# Patient Record
Sex: Male | Born: 1990
Health system: Southern US, Community
[De-identification: ages and names within clinical notes are randomized; demographics above are authoritative.]

## PROBLEM LIST (undated history)

## (undated) DIAGNOSIS — J302 Other seasonal allergic rhinitis: Secondary | ICD-10-CM

## (undated) DIAGNOSIS — E119 Type 2 diabetes mellitus without complications: Secondary | ICD-10-CM

## (undated) HISTORY — PX: WISDOM TOOTH EXTRACTION: SHX21

## (undated) HISTORY — DX: Type 2 diabetes mellitus without complications: E11.9

## (undated) HISTORY — DX: Other seasonal allergic rhinitis: J30.2

## (undated) HISTORY — PX: ROOT CANAL: SHX2363

## (undated) HISTORY — PX: ACHILLES TENDON REPAIR: SUR1153

---

## 2019-08-19 ENCOUNTER — Encounter: Payer: Self-pay | Admitting: Internal Medicine

## 2019-08-19 ENCOUNTER — Other Ambulatory Visit: Payer: Self-pay

## 2019-08-19 ENCOUNTER — Ambulatory Visit: Payer: Self-pay | Admitting: Internal Medicine

## 2019-08-19 VITALS — BP 118/70 | HR 66 | Temp 98.3°F | Resp 12 | Ht 66.0 in | Wt 203.0 lb

## 2019-08-19 DIAGNOSIS — J302 Other seasonal allergic rhinitis: Secondary | ICD-10-CM | POA: Insufficient documentation

## 2019-08-19 DIAGNOSIS — J0140 Acute pansinusitis, unspecified: Secondary | ICD-10-CM

## 2019-08-19 MED ORDER — AMOXICILLIN-POT CLAVULANATE 875-125 MG PO TABS: ORAL_TABLET | ORAL | 0 refills | Status: DC

## 2019-08-19 NOTE — Progress Notes (Signed)
Subjective:    Patient ID: Juan Friedman, male   DOB: [birth year], 28 y.o.   MRN: [id number]   HPI   Here to establish.  Started with symptoms on 07/21/2019.  Nasal congestion leading to loss of ability to smell.  Had COVID testing that was negative.   After a week, his left sided symptoms resolved.  Right side remains congested and painful.  Right ear hurts as well. Last few days, has had sneezing with bleeding from left nostril.  No posterior pharyngeal drainage. When blows nose, unable to get any mucous out.  Has spontaneous yellow drainage from right nostril. No fevers.  No cough. No dyspnea.  Has history of allergies:  Different pollen and grasses.  Has tried Copywriter, advertising, but no help. Steam helps for a short period of time.  No outpatient medications have been marked as taking for the 08/19/19 encounter (Office Visit) with Mack Hook, MD.   No Known Allergies   No past medical history on file.   No family history on file. Family Status  Relation Name Status  . Mother  Alive, age 65y  . Father  Alive, age 59y  . Son Wisdom Alive, age 71m   Social History   Socioeconomic History  . Marital status: Married    Spouse name: Mikayla  . Number of children: 1  . Years of education: Not on file  . Highest education level: Not on file  Occupational History  . Not on file  Tobacco Use  . Smoking status: Never Smoker  . Smokeless tobacco: Never Used  Substance and Sexual Activity  . Alcohol use: Never  . Drug use: Never  . Sexual activity: Not on file  Other Topics Concern  . Not on file  Social History Narrative   Lives with his wife, young son, and his grand-mother in [place]   Social Determinants of Health   Financial Resource Strain:   . Difficulty of Paying Living Expenses: Not on file  Food Insecurity:   . Worried About Charity fundraiser in the Last Year: Not on file  . Ran Out of Food in the Last Year: Not on file  Transportation Needs:   .  Lack of Transportation (Medical): Not on file  . Lack of Transportation (Non-Medical): Not on file  Physical Activity:   . Days of Exercise per Week: Not on file  . Minutes of Exercise per Session: Not on file  Stress:   . Feeling of Stress : Not on file  Social Connections:   . Frequency of Communication with Friends and Family: Not on file  . Frequency of Social Gatherings with Friends and Family: Not on file  . Attends Religious Services: Not on file  . Active Member of Clubs or Organizations: Not on file  . Attends Archivist Meetings: Not on file  . Marital Status: Not on file  Intimate Partner Violence:   . Fear of Current or Ex-Partner: Not on file  . Emotionally Abused: Not on file  . Physically Abused: Not on file  . Sexually Abused: Not on file              Review of Systems    Objective:   BP 118/70 (BP Location: Left Arm, Patient Position: Sitting, Cuff Size: Normal)   Pulse 66   Temp 98.3 F (36.8 C) (Oral)   Resp 12   Ht 5\' 6"  (1.676 m)   Wt 203 lb (92.1 kg)   BMI  32.77 kg/m   Physical Exam  NAD HEENT:  PERRL, EOMI, TMs pearly gray, right nostril completely obstructed with swelling and discharge.  Tender over right maxillary area. Neck:  Supple, No adenopathy, no thyromegaly Chest:  CTA CV:  RRR with normal HS and no murmur or rub.  Radial and DP pulses normal and equal Abd:  S, NT, + BS LE:  No edema. Assessment & Plan  Sinusitis:  Augmentin 875/125 mg twice daily for 10 days. Consider OTC oral antihistamine for any continued symptoms.

## 2019-08-19 NOTE — Patient Instructions (Signed)
Decongestant with Guaifenesin and phenylephrine. Nasal saline

## 2019-08-29 ENCOUNTER — Ambulatory Visit: Payer: Self-pay | Admitting: Internal Medicine

## 2020-03-12 ENCOUNTER — Emergency Department (HOSPITAL_COMMUNITY)
Admission: EM | Admit: 2020-03-12 | Discharge: 2020-03-12 | Disposition: A | Discharge status: Home / Self Care | Payer: Self-pay | Attending: Emergency Medicine | Admitting: Emergency Medicine

## 2020-03-12 ENCOUNTER — Emergency Department (HOSPITAL_COMMUNITY): Payer: Self-pay

## 2020-03-12 ENCOUNTER — Encounter (HOSPITAL_COMMUNITY): Payer: Self-pay | Admitting: *Deleted

## 2020-03-12 DIAGNOSIS — J029 Acute pharyngitis, unspecified: Secondary | ICD-10-CM | POA: Insufficient documentation

## 2020-03-12 DIAGNOSIS — R0981 Nasal congestion: Secondary | ICD-10-CM | POA: Insufficient documentation

## 2020-03-12 LAB — CBC
HCT: 46.4 % (ref 39.0–52.0)
Hemoglobin: 15.7 g/dL (ref 13.0–17.0)
MCH: 30.3 pg (ref 26.0–34.0)
MCHC: 33.8 g/dL (ref 30.0–36.0)
MCV: 89.6 fL (ref 80.0–100.0)
Platelets: 277 10*3/uL (ref 150–400)
RBC: 5.18 MIL/uL (ref 4.22–5.81)
RDW: 12 % (ref 11.5–15.5)
WBC: 10.5 10*3/uL (ref 4.0–10.5)
nRBC: 0 % (ref 0.0–0.2)

## 2020-03-12 LAB — COMPREHENSIVE METABOLIC PANEL
ALT: 45 U/L — ABNORMAL HIGH (ref 0–44)
AST: 28 U/L (ref 15–41)
Albumin: 4 g/dL (ref 3.5–5.0)
Alkaline Phosphatase: 37 U/L — ABNORMAL LOW (ref 38–126)
Anion gap: 12 (ref 5–15)
BUN: 14 mg/dL (ref 6–20)
CO2: 23 mmol/L (ref 22–32)
Calcium: 8.9 mg/dL (ref 8.9–10.3)
Chloride: 103 mmol/L (ref 98–111)
Creatinine, Ser: 1.41 mg/dL — ABNORMAL HIGH (ref 0.61–1.24)
GFR calc Af Amer: >60 mL/min (ref >60)
GFR calc non Af Amer: >60 mL/min (ref >60)
Glucose, Bld: 180 mg/dL — ABNORMAL HIGH (ref 70–99)
Potassium: 4.2 mmol/L (ref 3.5–5.1)
Sodium: 138 mmol/L (ref 135–145)
Total Bilirubin: 1.5 mg/dL — ABNORMAL HIGH (ref 0.3–1.2)
Total Protein: 7.2 g/dL (ref 6.5–8.1)

## 2020-03-12 LAB — GROUP A STREP BY PCR: Group A Strep by PCR: NOT DETECTED

## 2020-03-12 MED ORDER — PREDNISONE 20 MG PO TABS: 40.0000 mg | ORAL_TABLET | Freq: Every day | ORAL | 0 refills | Status: AC

## 2020-03-12 MED ORDER — IOHEXOL 300 MG/ML  SOLN
75.0000 mL | Freq: Once | INTRAMUSCULAR | Status: AC | PRN
  Administered 2020-03-12: 75 mL via INTRAVENOUS

## 2020-03-12 MED ORDER — SODIUM CHLORIDE 0.9 % IV BOLUS
1000.0000 mL | Freq: Once | INTRAVENOUS | Status: AC
  Administered 2020-03-12: 1000 mL via INTRAVENOUS

## 2020-03-12 MED ORDER — SODIUM CHLORIDE 0.9 % IV SOLN
3.0000 g | Freq: Once | INTRAVENOUS | Status: AC
  Administered 2020-03-12: 3 g via INTRAVENOUS
  Filled 2020-03-12: qty 8

## 2020-03-12 MED ORDER — METHYLPREDNISOLONE SODIUM SUCC 125 MG IJ SOLR
125.0000 mg | Freq: Once | INTRAMUSCULAR | Status: AC
  Administered 2020-03-12: 125 mg via INTRAVENOUS
  Filled 2020-03-12: qty 2

## 2020-03-12 MED ORDER — AMOXICILLIN-POT CLAVULANATE 875-125 MG PO TABS: 1.0000 | ORAL_TABLET | Freq: Two times a day (BID) | ORAL | 0 refills | Status: AC

## 2020-03-12 NOTE — Discharge Instructions (Addendum)
You were evaluated in the Emergency Department and after careful evaluation, we did not find any emergent condition requiring admission or further testing in the hospital.  Your exam/testing today was overall reassuring.  Your symptoms seem to be due to an upper respiratory infection and/or sinus infection.  Your swelling improved while here in the emergency department.  Please take the steroids and antibiotics as prescribed, start these medicines tomorrow morning.  Please return to the Emergency Department if you experience any worsening of your condition.  We encourage you to follow up with a primary care provider.  Thank you for allowing [place] to be a part of your care.

## 2020-03-12 NOTE — ED Notes (Signed)
Voice is clear- able to see uvula, still c/o nasal congestion

## 2020-03-12 NOTE — ED Notes (Signed)
Voice muffled, throat swollen, difficulty swallowing saliva, difficulty breathing.

## 2020-03-12 NOTE — ED Provider Notes (Signed)
[facility] Emergency Department Provider Note MRN:  [id number]  Arrival date & time: 03/12/20     Chief Complaint   Sore Throat and Nasal Congestion   History of Present Illness   Juan Friedman is a 29 y.o. year-old male with a history of seasonal allergies presenting to the ED with chief complaint of sore throat and nasal congestion.  2 or 3 days of sore throat and nasal congestion, progressively worsening, during his few hours in the waiting room his symptoms have worsened and he has noticed that his voice is changing, he is feeling swelling in his throat.  Denies recent fever, no cough, no chest pain or shortness of breath, no abdominal pain, no vomiting or diarrhea, no new medications, no daily medications, no history of throat swelling, no food allergies, no new exposures, no family history of facial or throat swelling.  Review of Systems  A complete 10 system review of systems was obtained and all systems are negative except as noted in the HPI and PMH.   Patient's Health History    Past Medical History:  Diagnosis Date  . Seasonal allergies     Past Surgical History:  Procedure Laterality Date  . ROOT CANAL    . WISDOM TOOTH EXTRACTION      No family history on file.  Social History   Socioeconomic History  . Marital status: Married    Spouse name: Mikayla  . Number of children: 1  . Years of education: Not on file  . Highest education level: Not on file  Occupational History  . Not on file  Tobacco Use  . Smoking status: Never Smoker  . Smokeless tobacco: Never Used  Substance and Sexual Activity  . Alcohol use: Never  . Drug use: Never  . Sexual activity: Not on file  Other Topics Concern  . Not on file  Social History Narrative   Lives with his wife, young son, and his grand-mother in [place]   Social Determinants of Health   Financial Resource Strain:   . Difficulty of Paying Living Expenses:   Food Insecurity:   . Worried  About Programme researcher, broadcasting/film/video in the Last Year:   . Barista in the Last Year:   Transportation Needs:   . Freight forwarder (Medical):   Marland Kitchen Lack of Transportation (Non-Medical):   Physical Activity:   . Days of Exercise per Week:   . Minutes of Exercise per Session:   Stress:   . Feeling of Stress :   Social Connections:   . Frequency of Communication with Friends and Family:   . Frequency of Social Gatherings with Friends and Family:   . Attends Religious Services:   . Active Member of Clubs or Organizations:   . Attends Banker Meetings:   Marland Kitchen Marital Status:   Intimate Partner Violence:   . Fear of Current or Ex-Partner:   . Emotionally Abused:   Marland Kitchen Physically Abused:   . Sexually Abused:      Physical Exam   Vitals:   03/12/20 0740 03/12/20 0800  BP: 136/76 122/79  Pulse: 64 63  Resp: 20 16  Temp:    SpO2: 99% 100%    CONSTITUTIONAL: Well-appearing, NAD NEURO:  Alert and oriented x 3, no focal deficits EYES:  eyes equal and reactive ENT/NECK:  no LAD, no JVD; diffuse swelling to the posterior oropharynx and tonsils with uvula protruding forward on top of the tongue, abnormal phonation CARDIO:  Regular rate, well-perfused, normal S1 and S2 PULM:  CTAB no wheezing or rhonchi GI/GU:  normal bowel sounds, non-distended, non-tender MSK/SPINE:  No gross deformities, no edema SKIN:  no rash, atraumatic PSYCH:  Appropriate speech and behavior  *Additional and/or pertinent findings included in MDM below  Diagnostic and Interventional Summary    EKG Interpretation  Date/Time:    Ventricular Rate:    PR Interval:    QRS Duration:   QT Interval:    QTC Calculation:   R Axis:     Text Interpretation:        Labs Reviewed  COMPREHENSIVE METABOLIC PANEL - Abnormal; Notable for the following components:      Result Value   Glucose, Bld 180 (*)    Creatinine, Ser 1.41 (*)    ALT 45 (*)    Alkaline Phosphatase 37 (*)    Total Bilirubin 1.5 (*)      All other components within normal limits  GROUP A STREP BY PCR  CBC    CT Soft Tissue Neck W Contrast  Final Result      Medications  methylPREDNISolone sodium succinate (SOLU-MEDROL) 125 mg/2 mL injection 125 mg (125 mg Intravenous Given 03/12/20 0737)  sodium chloride 0.9 % bolus 1,000 mL (0 mLs Intravenous Stopped 03/12/20 0940)  Ampicillin-Sulbactam (UNASYN) 3 g in sodium chloride 0.9 % 100 mL IVPB (0 g Intravenous Stopped 03/12/20 0939)  iohexol (OMNIPAQUE) 300 MG/ML solution 75 mL (75 mLs Intravenous Contrast Given 03/12/20 0813)     Procedures  /  Critical Care Procedures  ED Course and Medical Decision Making  I have reviewed the triage vital signs, the nursing notes, and pertinent available records from the EMR.  Listed above are laboratory and imaging tests that I personally ordered, reviewed, and interpreted and then considered in my medical decision making (see below for details).      Concern for airway swelling in this 29 year old male otherwise healthy, suspect underlying infectious process, no family history or personal history of angioedema, no new exposures or medications that would elicit such a reaction.  Will monitor his airway closely, currently in no acute distress, providing IV Solu-Medrol, IV antibiotics.  Will obtain CT imaging to evaluate for abscess or deep space neck infection.  Significant clinical significant clinical improvement here in the emergency department, CT reassuring, given the swelling will provide steroid burst, suspect sinus infection given the opacity on CT scan and the clinical symptoms, providing course of Augmentin as well.  Barth Kirks. Sedonia Small, [place] [email or web address]  Final Clinical Impressions(s) / ED Diagnoses     ICD-10-CM   1. Pharyngitis, unspecified etiology  J02.9     ED Discharge Orders         Ordered    predniSONE (DELTASONE) 20 MG tablet  Daily     03/12/20 0940     amoxicillin-clavulanate (AUGMENTIN) 875-125 MG tablet  Every 12 hours     03/12/20 0940           Discharge Instructions Discussed with and Provided to Patient:     Discharge Instructions     You were evaluated in the Emergency Department and after careful evaluation, we did not find any emergent condition requiring admission or further testing in the hospital.  Your exam/testing today was overall reassuring.  Your symptoms seem to be due to an upper respiratory infection and/or sinus infection.  Your swelling improved while here in the emergency department.  Please take  the steroids and antibiotics as prescribed, start these medicines tomorrow morning.  Please return to the Emergency Department if you experience any worsening of your condition.  We encourage you to follow up with a primary care provider.  Thank you for allowing [place] to be a part of your care.        Sabas Sous, MD 03/12/20 [phone]

## 2020-03-12 NOTE — ED Triage Notes (Signed)
To ED for eval of nasal congestion and sore throat. Started 2-3 days ago. States he typically will get a sinus infection around this time of year. States both sides of throat are sore and painful when swallowing. Airway open and resp e/u. Throat is red without exudate noted. Denies fevers. Taking sudafed at home.

## 2020-03-23 ENCOUNTER — Encounter (HOSPITAL_COMMUNITY): Payer: Self-pay

## 2020-03-23 ENCOUNTER — Emergency Department (HOSPITAL_COMMUNITY)
Admission: EM | Admit: 2020-03-23 | Discharge: 2020-03-23 | Disposition: A | Discharge status: Home / Self Care | Payer: Self-pay | Attending: Emergency Medicine | Admitting: Emergency Medicine

## 2020-03-23 ENCOUNTER — Other Ambulatory Visit: Payer: Self-pay

## 2020-03-23 DIAGNOSIS — J029 Acute pharyngitis, unspecified: Secondary | ICD-10-CM | POA: Insufficient documentation

## 2020-03-23 DIAGNOSIS — J00 Acute nasopharyngitis [common cold]: Secondary | ICD-10-CM | POA: Insufficient documentation

## 2020-03-23 NOTE — ED Provider Notes (Signed)
[place] EMERGENCY DEPARTMENT Provider Note   CSN: [id number] Arrival date & time: 03/23/20  0940     History Chief Complaint  Patient presents with  . Nasal Congestion    Juan Friedman is a 29 y.o. male who presents with scratchy throat.  He has a history of seasonal allergies.  He presents with scratchy throat and nasal congestion.  He had onset of the symptoms about 5 hours after consuming a Snapple apple.  This occurred previously when he drank the same drink. Works at a middle school.  He is unsure if he has been exposed to any viral illnesses.  He gets checked weekly at school for Covid.  He denies fevers. HPI     Past Medical History:  Diagnosis Date  . Seasonal allergies     Patient Active Problem List   Diagnosis Date Noted  . Seasonal allergies     Past Surgical History:  Procedure Laterality Date  . ROOT CANAL    . WISDOM TOOTH EXTRACTION         No family history on file.  Social History   Tobacco Use  . Smoking status: Never Smoker  . Smokeless tobacco: Never Used  Substance Use Topics  . Alcohol use: Never  . Drug use: Never    Home Medications Prior to Admission medications   Medication Sig Start Date End Date Taking? Authorizing Provider  pseudoephedrine (SUDAFED) 30 MG tablet Take 30 mg by mouth every 4 (four) hours as needed for congestion.    [provider]    Allergies    Patient has no known allergies.  Review of Systems   Review of Systems  HENT: Positive for congestion, rhinorrhea and sore throat.      Physical Exam Updated Vital Signs BP (!) 120/105 (BP Location: Left Arm)   Pulse 90   Temp 97.6 F (36.4 C) (Oral)   Resp 18   Ht 5\' 8"  (1.727 m)   Wt 93 kg   SpO2 96%   BMI 31.17 kg/m   Physical Exam Vitals and nursing note reviewed.  Constitutional:      General: He is not in acute distress.    Appearance: He is well-developed. He is not diaphoretic.  HENT:     Head: Normocephalic  and atraumatic.     Nose: Rhinorrhea present.     Mouth/Throat:     Mouth: Mucous membranes are moist.     Pharynx: Posterior oropharyngeal erythema present. No oropharyngeal exudate.  Eyes:     General: No scleral icterus.    Extraocular Movements: Extraocular movements intact.     Conjunctiva/sclera: Conjunctivae normal.     Pupils: Pupils are equal, round, and reactive to light.  Cardiovascular:     Rate and Rhythm: Normal rate and regular rhythm.     Heart sounds: Normal heart sounds.  Pulmonary:     Effort: Pulmonary effort is normal. No respiratory distress.     Breath sounds: Normal breath sounds.  Abdominal:     Palpations: Abdomen is soft.     Tenderness: There is no abdominal tenderness.  Musculoskeletal:     Cervical back: Normal range of motion and neck supple.  Skin:    General: Skin is warm and dry.  Neurological:     Mental Status: He is alert.  Psychiatric:        Behavior: Behavior normal.     ED Results / Procedures / Treatments   Labs (all labs ordered are listed,  but only abnormal results are displayed) Labs Reviewed - No data to display  EKG None  Radiology No results found.  Procedures Procedures (including critical care time)  Medications Ordered in ED Medications - No data to display  ED Course  I have reviewed the triage vital signs and the nursing notes.  Pertinent labs & imaging results that were available during my care of the patient were reviewed by me and considered in my medical decision making (see chart for details).    MDM Rules/Calculators/A&P                     . Patients symptoms are consistent with URI, likely viral etiology. Discussed that antibiotics are not indicated for viral infections. Pt will be discharged with symptomatic treatment.  Verbalizes understanding and is agreeable with plan. Pt is hemodynamically stable & in NAD prior to dc.  Final Clinical Impression(s) / ED Diagnoses Final diagnoses:  Acute  rhinitis  Sore throat    Rx / DC Orders ED Discharge Orders    None       Arthor Captain, PA-C 03/23/20 1044    Margarita Grizzle, MD 03/24/20 1152

## 2020-03-23 NOTE — Discharge Instructions (Addendum)
1) take Xyzal 1 tablet daily before bed 2) take Afrin nasal spray, 1 spray in each nostril 2 times a day for the next 3 days. 3) use over-the-counter cold medication for treatment of your symptoms.  Contact a health care provider if: You have a fever. Your symptoms are getting worse at home. Your symptoms are not responding to medicine. You develop new symptoms, especially a headache or nosebleed.

## 2020-03-23 NOTE — ED Triage Notes (Signed)
Patient complains of throat burning and congestion x 2 days, states that he thinks allergic reaction. Speaking complete sentences

## 2020-04-30 ENCOUNTER — Emergency Department (HOSPITAL_COMMUNITY)
Admission: EM | Admit: 2020-04-30 | Discharge: 2020-05-01 | Disposition: A | Discharge status: Home / Self Care | Payer: Self-pay | Attending: Emergency Medicine | Admitting: Emergency Medicine

## 2020-04-30 DIAGNOSIS — R739 Hyperglycemia, unspecified: Secondary | ICD-10-CM

## 2020-04-30 DIAGNOSIS — R7309 Other abnormal glucose: Secondary | ICD-10-CM

## 2020-04-30 DIAGNOSIS — E1165 Type 2 diabetes mellitus with hyperglycemia: Secondary | ICD-10-CM | POA: Insufficient documentation

## 2020-04-30 LAB — COMPREHENSIVE METABOLIC PANEL
ALT: 16 U/L (ref 0–44)
AST: 14 U/L — ABNORMAL LOW (ref 15–41)
Albumin: 4.4 g/dL (ref 3.5–5.0)
Alkaline Phosphatase: 46 U/L (ref 38–126)
Anion gap: 15 (ref 5–15)
BUN: 11 mg/dL (ref 6–20)
CO2: 18 mmol/L — ABNORMAL LOW (ref 22–32)
Calcium: 9.5 mg/dL (ref 8.9–10.3)
Chloride: 101 mmol/L (ref 98–111)
Creatinine, Ser: 1.37 mg/dL — ABNORMAL HIGH (ref 0.61–1.24)
GFR calc Af Amer: >60 mL/min (ref >60)
GFR calc non Af Amer: >60 mL/min (ref >60)
Glucose, Bld: 300 mg/dL — ABNORMAL HIGH (ref 70–99)
Potassium: 4.3 mmol/L (ref 3.5–5.1)
Sodium: 134 mmol/L — ABNORMAL LOW (ref 135–145)
Total Bilirubin: 2.3 mg/dL — ABNORMAL HIGH (ref 0.3–1.2)
Total Protein: 7.8 g/dL (ref 6.5–8.1)

## 2020-04-30 LAB — CBG MONITORING, ED: Glucose-Capillary: 302 mg/dL — ABNORMAL HIGH (ref 70–99)

## 2020-04-30 LAB — URINALYSIS, ROUTINE W REFLEX MICROSCOPIC
Bilirubin Urine: NEGATIVE
Glucose, UA: >=500 mg/dL — AB
Hgb urine dipstick: NEGATIVE
Ketones, ur: 80 mg/dL — AB
Leukocytes,Ua: NEGATIVE
Nitrite: NEGATIVE
Protein, ur: 30 mg/dL — AB
Specific Gravity, Urine: 1.039 — ABNORMAL HIGH (ref 1.005–1.030)
pH: 5 (ref 5.0–8.0)

## 2020-04-30 LAB — CBC
HCT: 46.3 % (ref 39.0–52.0)
Hemoglobin: 16.1 g/dL (ref 13.0–17.0)
MCH: 30.4 pg (ref 26.0–34.0)
MCHC: 34.8 g/dL (ref 30.0–36.0)
MCV: 87.5 fL (ref 80.0–100.0)
Platelets: 296 10*3/uL (ref 150–400)
RBC: 5.29 MIL/uL (ref 4.22–5.81)
RDW: 13.1 % (ref 11.5–15.5)
WBC: 7.7 10*3/uL (ref 4.0–10.5)
nRBC: 0 % (ref 0.0–0.2)

## 2020-04-30 MED ORDER — SODIUM CHLORIDE 0.9 % IV BOLUS
1000.0000 mL | Freq: Once | INTRAVENOUS | Status: AC
  Administered 2020-05-01: 1000 mL via INTRAVENOUS

## 2020-04-30 NOTE — ED Triage Notes (Signed)
Pt here from home with c/o frequent urination and thirst and some wt loss , pt wants to see if he is diabetic , pt has grandparents who are diabetic

## 2020-05-01 ENCOUNTER — Telehealth: Payer: Self-pay

## 2020-05-01 LAB — HEMOGLOBIN A1C
Hgb A1c MFr Bld: 12.7 % — ABNORMAL HIGH (ref 4.8–5.6)
Mean Plasma Glucose: 317.79 mg/dL

## 2020-05-01 LAB — CBG MONITORING, ED
Glucose-Capillary: 405 mg/dL — ABNORMAL HIGH (ref 70–99)
Glucose-Capillary: 343 mg/dL — ABNORMAL HIGH (ref 70–99)

## 2020-05-01 LAB — MAGNESIUM: Magnesium: 1.8 mg/dL (ref 1.7–2.4)

## 2020-05-01 LAB — CK: Total CK: 103 U/L (ref 49–397)

## 2020-05-01 MED ORDER — LIVING WELL WITH DIABETES BOOK
Freq: Once | Status: AC
  Filled 2020-05-01: qty 1

## 2020-05-01 MED ORDER — INSULIN GLARGINE 100 UNIT/ML ~~LOC~~ SOLN: 10.0000 [IU] | Freq: Every day | SUBCUTANEOUS | 1 refills | Status: DC

## 2020-05-01 MED ORDER — NEEDLES & SYRINGES MISC: 0 refills | Status: DC

## 2020-05-01 MED ORDER — INSULIN GLARGINE 100 UNIT/ML ~~LOC~~ SOLN
10.0000 [IU] | Freq: Once | SUBCUTANEOUS | Status: AC
  Administered 2020-05-01: 10 [IU] via SUBCUTANEOUS
  Filled 2020-05-01: qty 0.1

## 2020-05-01 MED ORDER — INSULIN ASPART 100 UNIT/ML ~~LOC~~ SOLN
5.0000 [IU] | Freq: Once | SUBCUTANEOUS | Status: AC
  Administered 2020-05-01: 5 [IU] via INTRAVENOUS

## 2020-05-01 MED ORDER — INSULIN STARTER KIT- SYRINGES (ENGLISH)
1.0000 | Freq: Once | Status: AC
  Administered 2020-05-01: 1
  Filled 2020-05-01: qty 1

## 2020-05-01 MED ORDER — BLOOD GLUCOSE MONITOR KIT: PACK | 0 refills | Status: AC

## 2020-05-01 NOTE — ED Notes (Signed)
pharmacist at bedside teaching pt about insulin.

## 2020-05-01 NOTE — Telephone Encounter (Signed)
Called patient to see how he was doing, and tell him about MATCH program. Match paperwork sent to his pharmacy [facility] on [address]. They I spoke with them, faxed over [facility] paperwork. they Are aware of patient and will help him navigate with glucose meters. Answered questions, patient expressed that he felt he received good and plenty of information that was understandable in the ED and feels able to perform injections and glucose monitoring. Will be following up with community health on Monday to get a appointment

## 2020-05-01 NOTE — Discharge Instructions (Addendum)
Thank you for allowing me to care for you today in the Emergency Department.    Your blood sugar was elevated today.  We have started you on a type of insulin called Lantus.  Inject 10 units of Lantus into the skin every night.  You can take your prescriptions to the pharmacy at [facility] to get them filled.   You should receive a call from the social worker at the hospital tomorrow to help you get set up with an appointment with your primary care clinician.  They can also give you additional instructions on getting your medications filled at the pharmacy.  You were given a dose of Lantus tonight in the ER.  You should return to the emergency department if you develop shortness of breath, confusion, severe, uncontrollable abdominal pain, vomiting, or other new, concerning symptoms.

## 2020-05-01 NOTE — ED Provider Notes (Signed)
[facility] EMERGENCY DEPARTMENT Provider Note   CSN: [id number] Arrival date & time: 04/30/20  1738     History No chief complaint on file.   Juan Friedman is a 29 y.o. male with a history of seasonal allergies who presents to the emergency department with a chief complaint of polyuria and polydipsia.  The patient reports that he was seen in the ER for a sore throat on 5/22 and was discharged with Augmentin and a 5-day course of steroids.  He reports that since finishing the course of steroids that he has had gradually worsening polyuria, polydipsia, dry mouth, paresthesias, blurry vision, fatigue, and weight loss.  He reports that over the last 3 days that he began having cramping in his bilateral calves at night.  Despite drinking plenty of fluids, his urine has been very clear.  He was concerned that due to the frequency that he might be developing a UTI so he has been drinking cranberry juice to try and help with his symptoms.  He reports that his grandparents have diabetes and he spoke with his mom about his symptoms and she advised him to come and get tested.  He denies fever, chills, vomiting, diarrhea, diplopia, chest pain, shortness of breath, confusion.  No treatment prior to arrival.  The history is provided by the patient. No language interpreter was used.       Past Medical History:  Diagnosis Date  . Seasonal allergies     Patient Active Problem List   Diagnosis Date Noted  . Seasonal allergies     Past Surgical History:  Procedure Laterality Date  . ROOT CANAL    . WISDOM TOOTH EXTRACTION         No family history on file.  Social History   Tobacco Use  . Smoking status: Never Smoker  . Smokeless tobacco: Never Used  Vaping Use  . Vaping Use: Never used  Substance Use Topics  . Alcohol use: Never  . Drug use: Never    Home Medications Prior to Admission medications   Medication Sig Start Date End Date Taking? Authorizing  Provider  blood glucose meter kit and supplies KIT Dispense based on patient and insurance preference. Use up to four times daily as directed. (FOR ICD-9 250.00, 250.01). 05/01/20   Trayvon Trumbull A, PA-C  insulin glargine (LANTUS) 100 UNIT/ML injection Inject 0.1 mLs (10 Units total) into the skin daily. 05/01/20 05/31/20  Deegan Valentino A, PA-C  Needles & Syringes MISC Supply as needed to go with lancets. 05/01/20   Lanayah Gartley A, PA-C    Allergies    Patient has no known allergies.  Review of Systems   Review of Systems  Constitutional: Positive for fatigue and unexpected weight change. Negative for appetite change, chills and fever.  HENT: Negative for congestion, sinus pressure and sore throat.        Xerostomia  Respiratory: Negative for shortness of breath.   Cardiovascular: Negative for chest pain.  Gastrointestinal: Negative for abdominal pain, diarrhea, nausea and vomiting.  Endocrine: Positive for polydipsia and polyuria.  Genitourinary: Positive for frequency. Negative for dysuria.  Musculoskeletal: Positive for myalgias. Negative for back pain.  Skin: Negative for rash.  Allergic/Immunologic: Negative for immunocompromised state.  Neurological: Negative for seizures, syncope, weakness, numbness and headaches.  Psychiatric/Behavioral: Negative for confusion.    Physical Exam Updated Vital Signs BP 111/77   Pulse (!) 58   Temp 98.6 F (37 C) (Oral)   Resp 18  SpO2 99%   Physical Exam Vitals and nursing note reviewed.  Constitutional:      General: He is not in acute distress.    Appearance: He is well-developed. He is not ill-appearing, toxic-appearing or diaphoretic.  HENT:     Head: Normocephalic.     Mouth/Throat:     Mouth: Mucous membranes are moist.  Eyes:     Conjunctiva/sclera: Conjunctivae normal.     Pupils: Pupils are equal, round, and reactive to light.  Cardiovascular:     Rate and Rhythm: Normal rate and regular rhythm.     Heart sounds: No murmur  heard.   Pulmonary:     Effort: Pulmonary effort is normal. No respiratory distress.     Breath sounds: Normal breath sounds. No stridor. No wheezing, rhonchi or rales.  Chest:     Chest wall: No tenderness.  Abdominal:     General: There is no distension.     Palpations: Abdomen is soft. There is no mass.     Tenderness: There is no abdominal tenderness. There is no right CVA tenderness, left CVA tenderness, guarding or rebound.     Hernia: No hernia is present.     Comments: Abdomen soft, nontender, nondistended.  Musculoskeletal:     Cervical back: Neck supple.  Skin:    General: Skin is warm and dry.     Capillary Refill: Capillary refill takes less than 2 seconds.  Neurological:     Mental Status: He is alert.  Psychiatric:        Behavior: Behavior normal.     ED Results / Procedures / Treatments   Labs (all labs ordered are listed, but only abnormal results are displayed) Labs Reviewed  COMPREHENSIVE METABOLIC PANEL - Abnormal; Notable for the following components:      Result Value   Sodium 134 (*)    CO2 18 (*)    Glucose, Bld 300 (*)    Creatinine, Ser 1.37 (*)    AST 14 (*)    Total Bilirubin 2.3 (*)    All other components within normal limits  URINALYSIS, ROUTINE W REFLEX MICROSCOPIC - Abnormal; Notable for the following components:   Specific Gravity, Urine 1.039 (*)    Glucose, UA >=500 (*)    Ketones, ur 80 (*)    Protein, ur 30 (*)    Bacteria, UA RARE (*)    All other components within normal limits  HEMOGLOBIN A1C - Abnormal; Notable for the following components:   Hgb A1c MFr Bld 12.7 (*)    All other components within normal limits  CBG MONITORING, ED - Abnormal; Notable for the following components:   Glucose-Capillary 302 (*)    All other components within normal limits  CBG MONITORING, ED - Abnormal; Notable for the following components:   Glucose-Capillary 405 (*)    All other components within normal limits  CBG MONITORING, ED -  Abnormal; Notable for the following components:   Glucose-Capillary 343 (*)    All other components within normal limits  CBC  MAGNESIUM  CK    EKG None  Radiology No results found.  Procedures Procedures (including critical care time)  Medications Ordered in ED Medications  sodium chloride 0.9 % bolus 1,000 mL (0 mLs Intravenous Stopped 05/01/20 0152)  insulin glargine (LANTUS) injection 10 Units (10 Units Subcutaneous Given 05/01/20 0330)  insulin aspart (novoLOG) injection 5 Units (5 Units Intravenous Given 05/01/20 0328)  living well with diabetes book MISC ( Does not apply Given  05/01/20 0526)  insulin starter kit- syringes (English) 1 kit (1 kit Other Given 05/01/20 0526)    ED Course  I have reviewed the triage vital signs and the nursing notes.  Pertinent labs & imaging results that were available during my care of the patient were reviewed by me and considered in my medical decision making (see chart for details).    MDM Rules/Calculators/A&P                          29 year old male presenting with a month of polyuria, polydipsia, fatigue, unexplained weight loss, blurred vision who has developed muscle cramps over the last 3 days.  He has hyperglycemia of 300 with bicarb of 18, but anion gap is normal.  UA with greater than 500 glucosuria as well as ketones.  UA is noninfectious.  Regarding his muscle cramps, he has no electrolyte derangements and CK is normal.  While awaiting electrolytes, obtained A1c, which was noted to be elevated at 12.7.  Patient was initially treated with IV fluids for hyperglycemia as he does not meet criteria for DKA or HHS.  Discussed via phone with Dr. Hal Hope, hospitalist, since patient does not currently have medical insurance and is just getting diagnosed with diabetes mellitus.  He recommends starting the patient on 10 of Lantus daily and providing diabetes education in the ER.  10 of Lantus given in the ER, but when CBG was rechecked  following fluids, it had increased to 400.  NovoLog was given and glucose is now downtrending.  Diabetes education provided by pharmacy.  Transition of care consult has been placed as patient will need to be established with primary care and may also require medication assistance since he does not currently have insurance.  Will give the paper prescription so he can take this to the [facility] pharmacy once he speaks with case management.  The patient was discussed and independently evaluated with Dr. Christy Gentles, attending physician.  He is hemodynamically stable and in no acute distress.  He was observed for many hours in the ER without decompensation.  Safe for discharge home with outpatient follow-up as indicated.  Final Clinical Impression(s) / ED Diagnoses Final diagnoses:  Hyperglycemia  Elevated hemoglobin A1c    Rx / DC Orders ED Discharge Orders         Ordered    insulin glargine (LANTUS) 100 UNIT/ML injection  Daily     Discontinue  Reprint     05/01/20 0518    blood glucose meter kit and supplies KIT     Discontinue  Reprint     05/01/20 0518    Needles & Syringes MISC     Discontinue  Reprint     05/01/20 0518           Melaya Hoselton A, PA-C 05/01/20 0102    Ripley Fraise, MD 05/01/20 2356

## 2020-05-01 NOTE — ED Notes (Signed)
Pt verbalized understanding of d/c instructions, follow up carea nd s/s requiring return to ed. Pt had no further questions and ambulated to exit.

## 2020-05-06 ENCOUNTER — Telehealth: Payer: Self-pay

## 2020-05-06 NOTE — Telephone Encounter (Signed)
Called into my phone regarding diabetes specialist returned call, and instructed him to [facility] and make appointment to get medications, diabetes education and specialist  Consulted, they only take insurance, refer self- pay to [facility].  He will call [facility]- discussed long wait on phone, and to hang in there.

## 2020-07-20 ENCOUNTER — Encounter: Payer: Self-pay | Admitting: Family Medicine

## 2020-07-20 ENCOUNTER — Other Ambulatory Visit: Payer: Self-pay

## 2020-07-20 ENCOUNTER — Ambulatory Visit: Payer: Self-pay | Attending: Family Medicine | Admitting: Family Medicine

## 2020-07-20 VITALS — BP 122/76 | HR 74 | Temp 98.1°F | Ht 68.0 in | Wt 197.2 lb

## 2020-07-20 DIAGNOSIS — Z1159 Encounter for screening for other viral diseases: Secondary | ICD-10-CM

## 2020-07-20 DIAGNOSIS — Z794 Long term (current) use of insulin: Secondary | ICD-10-CM

## 2020-07-20 DIAGNOSIS — E119 Type 2 diabetes mellitus without complications: Secondary | ICD-10-CM

## 2020-07-20 LAB — GLUCOSE, POCT (MANUAL RESULT ENTRY): POC Glucose: 108 mg/dl — AB (ref 70–99)

## 2020-07-20 MED ORDER — INSULIN GLARGINE 100 UNIT/ML ~~LOC~~ SOLN: 10.0000 [IU] | Freq: Every day | SUBCUTANEOUS | 6 refills | Status: DC

## 2020-07-20 MED ORDER — NEEDLES & SYRINGES MISC: 6 refills | Status: AC

## 2020-07-20 NOTE — Progress Notes (Signed)
Subjective:  Patient ID: Juan Friedman, male    DOB: Jun 17, 1991  Age: 29 y.o. MRN: [id number]  CC: New Patient (Initial Visit)   HPI Juan Friedman is a 29 year old male who presents today to establish care. He relocated to [place] from [place] 3 years ago and since being in [place] he has [facility] Amil Amen. Diagnosed with Type 2 DM in 04/2020 with an A1c of 12.7.  He does have a family history of diabetes mellitus. Blood sugars have been in the low 100s with the highest around145 He would like to see "a diabetes specialist". Has no additional concerns today.  Past Medical History:  Diagnosis Date  . Diabetes mellitus without complication ([facility])   . Seasonal allergies     Past Surgical History:  Procedure Laterality Date  . ROOT CANAL    . WISDOM TOOTH EXTRACTION      Family History  Problem Relation Age of Onset  . Diabetes Maternal Grandmother   . Diabetes Paternal Grandmother     No Known Allergies  Outpatient Medications Prior to Visit  Medication Sig Dispense Refill  . blood glucose meter kit and supplies KIT Dispense based on patient and insurance preference. Use up to four times daily as directed. (FOR ICD-9 250.00, 250.01). 1 each 0  . Needles & Syringes MISC Supply as needed to go with lancets. 1 each 0  . insulin glargine (LANTUS) 100 UNIT/ML injection Inject 0.1 mLs (10 Units total) into the skin daily. 10 mL 1   No facility-administered medications prior to visit.     ROS Review of Systems  Constitutional: Negative for activity change and appetite change.  HENT: Negative for sinus pressure and sore throat.   Eyes: Negative for visual disturbance.  Respiratory: Negative for cough, chest tightness and shortness of breath.   Cardiovascular: Negative for chest pain and leg swelling.  Gastrointestinal: Negative for abdominal distention, abdominal pain, constipation and diarrhea.  Endocrine: Negative.     Genitourinary: Negative for dysuria.  Musculoskeletal: Negative for joint swelling and myalgias.  Skin: Negative for rash.  Allergic/Immunologic: Negative.   Neurological: Negative for weakness, light-headedness and numbness.  Psychiatric/Behavioral: Negative for dysphoric mood and suicidal ideas.    Objective:  BP 122/76   Pulse 74   Temp 98.1 F (36.7 C) (Oral)   Ht '5\' 8"'  (1.727 m)   Wt 197 lb 3.2 oz (89.4 kg)   SpO2 98%   BMI 29.98 kg/m   BP/Weight 07/20/2020 3/50/0938 10/31/2991  Systolic BP 716 967 893  Diastolic BP 76 77 810  Wt. (Lbs) 197.2 - 205  BMI 29.98 - 31.17      Physical Exam Constitutional:      Appearance: He is well-developed.  Neck:     Vascular: No JVD.  Cardiovascular:     Rate and Rhythm: Normal rate.     Heart sounds: Normal heart sounds. No murmur heard.   Pulmonary:     Effort: Pulmonary effort is normal.     Breath sounds: Normal breath sounds. No wheezing or rales.  Chest:     Chest wall: No tenderness.  Abdominal:     General: Bowel sounds are normal. There is no distension.     Palpations: Abdomen is soft. There is no mass.     Tenderness: There is no abdominal tenderness.  Musculoskeletal:        General: Normal range of motion.     Right lower leg: No  edema.     Left lower leg: No edema.  Neurological:     Mental Status: He is alert and oriented to person, place, and time.  Psychiatric:        Mood and Affect: Mood normal.      Diabetic Foot Exam - Simple   Simple Foot Form Diabetic Foot exam was performed with the following findings: Yes 07/20/2020  2:33 PM  Visual Inspection No deformities, no ulcerations, no other skin breakdown bilaterally: Yes Sensation Testing Intact to touch and monofilament testing bilaterally: Yes Pulse Check Posterior Tibialis and Dorsalis pulse intact bilaterally: Yes Comments     CMP Latest Ref Rng & Units 04/30/2020 03/12/2020  Glucose 70 - 99 mg/dL 300(H) 180(H)  BUN 6 - 20 mg/dL 11 14   Creatinine 0.61 - 1.24 mg/dL 1.37(H) 1.41(H)  Sodium 135 - 145 mmol/L 134(L) 138  Potassium 3.5 - 5.1 mmol/L 4.3 4.2  Chloride 98 - 111 mmol/L 101 103  CO2 22 - 32 mmol/L 18(L) 23  Calcium 8.9 - 10.3 mg/dL 9.5 8.9  Total Protein 6.5 - 8.1 g/dL 7.8 7.2  Total Bilirubin 0.3 - 1.2 mg/dL 2.3(H) 1.5(H)  Alkaline Phos 38 - 126 U/L 46 37(L)  AST 15 - 41 U/L 14(L) 28  ALT 0 - 44 U/L 16 45(H)    Lipid Panel  No results found for: CHOL, TRIG, HDL, CHOLHDL, VLDL, LDLCALC, LDLDIRECT  CBC    Component Value Date/Time   WBC 7.7 04/30/2020 1806   RBC 5.29 04/30/2020 1806   HGB 16.1 04/30/2020 1806   HCT 46.3 04/30/2020 1806   PLT 296 04/30/2020 1806   MCV 87.5 04/30/2020 1806   MCH 30.4 04/30/2020 1806   MCHC 34.8 04/30/2020 1806   RDW 13.1 04/30/2020 1806    Lab Results  Component Value Date   HGBA1C 12.7 (H) 05/01/2020    Assessment & Plan:  1. Type 2 diabetes mellitus without complication, with long-term current use of insulin (HCC) Uncontrolled with A1c of 12.7; goal is less than 7.0 We will refer to clinical pharmacist in-house for diabetic teaching He is due for A1c in 2 weeks Based on the fact that blood sugar log reveals improvement I will hold off on making any adjustments today until his A1c is obtained Counseled on Diabetic diet, my plate method, 782 minutes of moderate intensity exercise/week Blood sugar logs with fasting goals of 80-120 mg/dl, random of less than 180 and in the event of sugars less than 60 mg/dl or greater than 400 mg/dl encouraged to notify the clinic. Advised on the need for annual eye exams, annual foot exams, Pneumonia vaccine. - POCT glucose (manual entry) - insulin glargine (LANTUS) 100 UNIT/ML injection; Inject 0.1 mLs (10 Units total) into the skin daily.  Dispense: 10 mL; Refill: 6 - Needles & Syringes MISC; For use with Lantus  Dispense: 100 each; Refill: 6 - CMP14+EGFR - Microalbumin / creatinine urine ratio - Hemoglobin A1c; Future  2.  Need for hepatitis C screening test - HCV RNA quant rflx ultra or genotyp([facility]/Sun[facility])    Meds ordered this encounter  Medications  . insulin glargine (LANTUS) 100 UNIT/ML injection    Sig: Inject 0.1 mLs (10 Units total) into the skin daily.    Dispense:  10 mL    Refill:  6  . Needles & Syringes MISC    Sig: For use with Lantus    Dispense:  100 each    Refill:  6    Follow-up: Return  in about 13 days (around 08/02/2020) for [facility] - diabetic teaching; PCP 3 months.       Charlott Rakes, MD, FAAFP. [facility] [place], [place]   07/20/2020, 4:05 PM

## 2020-07-20 NOTE — Progress Notes (Signed)
Has a family history of diabetes.  Needs refill.   [id number]

## 2020-07-20 NOTE — Patient Instructions (Signed)
Diabetes Mellitus and Foot Care Foot care is an important part of your health, especially when you have diabetes. Diabetes may cause you to have problems because of poor blood flow (circulation) to your feet and legs, which can cause your skin to:  Become thinner and drier.  Break more easily.  Heal more slowly.  Peel and crack. You may also have nerve damage (neuropathy) in your legs and feet, causing decreased feeling in them. This means that you may not notice minor injuries to your feet that could lead to more serious problems. Noticing and addressing any potential problems early is the best way to prevent future foot problems. How to care for your feet Foot hygiene  Wash your feet daily with warm water and mild soap. Do not use hot water. Then, pat your feet and the areas between your toes until they are completely dry. Do not soak your feet as this can dry your skin.  Trim your toenails straight across. Do not dig under them or around the cuticle. File the edges of your nails with an emery board or nail file.  Apply a moisturizing lotion or petroleum jelly to the skin on your feet and to dry, brittle toenails. Use lotion that does not contain alcohol and is unscented. Do not apply lotion between your toes. Shoes and socks  Wear clean socks or stockings every day. Make sure they are not too tight. Do not wear knee-high stockings since they may decrease blood flow to your legs.  Wear shoes that fit properly and have enough cushioning. Always look in your shoes before you put them on to be sure there are no objects inside.  To break in new shoes, wear them for just a few hours a day. This prevents injuries on your feet. Wounds, scrapes, corns, and calluses  Check your feet daily for blisters, cuts, bruises, sores, and redness. If you cannot see the bottom of your feet, use a mirror or ask someone for help.  Do not cut corns or calluses or try to remove them with medicine.  If you  find a minor scrape, cut, or break in the skin on your feet, keep it and the skin around it clean and dry. You may clean these areas with mild soap and water. Do not clean the area with peroxide, alcohol, or iodine.  If you have a wound, scrape, corn, or callus on your foot, look at it several times a day to make sure it is healing and not infected. Check for: ? Redness, swelling, or pain. ? Fluid or blood. ? Warmth. ? Pus or a bad smell. General instructions  Do not cross your legs. This may decrease blood flow to your feet.  Do not use heating pads or hot water bottles on your feet. They may burn your skin. If you have lost feeling in your feet or legs, you may not know this is happening until it is too late.  Protect your feet from hot and cold by wearing shoes, such as at the beach or on hot pavement.  Schedule a complete foot exam at least once a year (annually) or more often if you have foot problems. If you have foot problems, report any cuts, sores, or bruises to your health care provider immediately. Contact a health care provider if:  You have a medical condition that increases your risk of infection and you have any cuts, sores, or bruises on your feet.  You have an injury that is not   healing.  You have redness on your legs or feet.  You feel burning or tingling in your legs or feet.  You have pain or cramps in your legs and feet.  Your legs or feet are numb.  Your feet always feel cold.  You have pain around a toenail. Get help right away if:  You have a wound, scrape, corn, or callus on your foot and: ? You have pain, swelling, or redness that gets worse. ? You have fluid or blood coming from the wound, scrape, corn, or callus. ? Your wound, scrape, corn, or callus feels warm to the touch. ? You have pus or a bad smell coming from the wound, scrape, corn, or callus. ? You have a fever. ? You have a red line going up your leg. Summary  Check your feet every day  for cuts, sores, red spots, swelling, and blisters.  Moisturize feet and legs daily.  Wear shoes that fit properly and have enough cushioning.  If you have foot problems, report any cuts, sores, or bruises to your health care provider immediately.  Schedule a complete foot exam at least once a year (annually) or more often if you have foot problems. This information is not intended to replace advice given to you by your health care provider. Make sure you discuss any questions you have with your health care provider. Document Revised: 07/02/2019 Document Reviewed: 11/10/2016 Elsevier Patient Education  2020 Elsevier Inc.  

## 2020-07-22 ENCOUNTER — Telehealth: Payer: Self-pay | Admitting: Family Medicine

## 2020-07-22 NOTE — Telephone Encounter (Signed)
Patient is calling for patient medication assistance for insulin glargine (LANTUS) 100 UNIT/ML injection [[id number]] and syringes please advise CB- [phone]

## 2020-07-23 ENCOUNTER — Other Ambulatory Visit: Payer: Self-pay | Admitting: Pharmacist

## 2020-07-23 DIAGNOSIS — Z794 Long term (current) use of insulin: Secondary | ICD-10-CM

## 2020-07-23 MED ORDER — "INSULIN SYRINGE-NEEDLE U-100 31G X 5/16"" 0.3 ML MISC": 2 refills | Status: DC

## 2020-07-23 MED ORDER — INSULIN GLARGINE 100 UNIT/ML ~~LOC~~ SOLN: 10.0000 [IU] | Freq: Every day | SUBCUTANEOUS | 6 refills | Status: DC

## 2020-07-23 MED FILL — !LANTUS 100 UNITS/ML VIAL: 100 | 28 days supply | Qty: 10 | Fill #0

## 2020-07-23 MED FILL — TRUEPLUS SYR 0.3ML 31GX5/16: 31G X 5/16" | 90 days supply | Qty: 100 | Fill #0

## 2020-07-23 NOTE — Telephone Encounter (Signed)
Patient is calling back to speak to [facility] in regards to medication Patient assistance. Please advise CB- [phone]

## 2020-07-23 NOTE — Progress Notes (Signed)
Call placed to patient. He has [employer] [place] Medicaid and therefore no insurance coverage. I resent his insulin to our pharmacy so he could get started with patient assistance.

## 2020-07-23 NOTE — Telephone Encounter (Signed)
Item already addressed. Pt was called and rx for Lantus was sent to our pharmacy for patient assistance.

## 2020-08-03 ENCOUNTER — Ambulatory Visit: Payer: Medicaid Other | Attending: Family Medicine | Admitting: Pharmacist

## 2020-08-03 ENCOUNTER — Other Ambulatory Visit: Payer: Self-pay

## 2020-08-03 ENCOUNTER — Encounter: Payer: Self-pay | Admitting: Pharmacist

## 2020-08-03 DIAGNOSIS — Z794 Long term (current) use of insulin: Secondary | ICD-10-CM

## 2020-08-03 DIAGNOSIS — E119 Type 2 diabetes mellitus without complications: Secondary | ICD-10-CM

## 2020-08-03 DIAGNOSIS — Z1159 Encounter for screening for other viral diseases: Secondary | ICD-10-CM

## 2020-08-03 LAB — GLUCOSE, POCT (MANUAL RESULT ENTRY): POC Glucose: 97 mg/dl (ref 70–99)

## 2020-08-03 NOTE — Progress Notes (Signed)
    S:    PCP: Dr. Alvis Lemmings   No chief complaint on file.  Patient arrives in good spirits.  Presents for diabetes evaluation, education, and management. Patient was referred and last seen by Primary Care Provider on 07/20/2020.  Pt was dx with Type 2 DM in 04/2020 with an A1c of 12.7.   Family/Social History:  -Fhx: DM -Tobacco: never smoker  -Alcohol: denies use   Insurance coverage/medication affordability: [place] Medicaid (Family Planning); no rx drug coverage. Pt gets his medication from our pharmacy.   Medication adherence reported.   Current diabetes medications include: Latnus 10 units daily   Patient denies hypoglycemic events.  Patient reported dietary habits: Eats 2-3 meals/day Breakfast: fruit smoothies + Powerade; sometimes [place] sausage   Lunch: will sometimes pick this up Dinner: lean poultry + greens + rice   Patient-reported exercise habits:  - Childcare    Patient denies polyuria, polydipsia, or polyphagia.  Patient denies neuropathy (nerve pain). Patient denies visual changes. Patient reports self foot exams.     O:  POCT: 97   Lab Results  Component Value Date   HGBA1C 12.7 (H) 05/01/2020   There were no vitals filed for this visit.  Lipid Panel  No results found for: CHOL, TRIG, HDL, CHOLHDL, VLDL, LDLCALC, LDLDIRECT  Home fasting blood sugars: low 100s per pt   2 hour post-meal/random blood sugars: no values <150.   Clinical Atherosclerotic Cardiovascular Disease (ASCVD): No  The ASCVD Risk score Denman George DC Jr., et al., 2013) failed to calculate for the following reasons:   The 2013 ASCVD risk score is only valid for ages 46 to 13    A/P: Diabetes newly dx currently uncontrolled based on A1c, however, home glycemic control has improved. Anticipate A1c today to be improved. Patient is able to verbalize appropriate hypoglycemia management plan. Medication adherence appears appropriate.  -Continue current regimen. -Extensively discussed  pathophysiology of diabetes, recommended lifestyle interventions, dietary effects on blood sugar control -Counseled on s/sx of and management of hypoglycemia -Labs today per PCP   Written patient instructions provided.  Total time in face to face counseling 30 minutes.   Follow up w/ PCP Clinic Visit 10/20/20.    Butch Penny, PharmD, CPP Clinical Pharmacist [facility] & [facility] [phone]

## 2020-08-05 ENCOUNTER — Other Ambulatory Visit: Payer: Self-pay | Admitting: Family Medicine

## 2020-08-05 LAB — CMP14+EGFR
ALT: 16 IU/L (ref 0–44)
AST: 13 IU/L (ref 0–40)
Albumin/Globulin Ratio: 1.8 (ref 1.2–2.2)
Albumin: 4.6 g/dL (ref 4.1–5.2)
Alkaline Phosphatase: 39 IU/L — ABNORMAL LOW (ref 44–121)
BUN/Creatinine Ratio: 12 (ref 9–20)
BUN: 13 mg/dL (ref 6–20)
Bilirubin Total: 1.1 mg/dL (ref 0.0–1.2)
CO2: 25 mmol/L (ref 20–29)
Calcium: 9.6 mg/dL (ref 8.7–10.2)
Chloride: 100 mmol/L (ref 96–106)
Creatinine, Ser: 1.11 mg/dL (ref 0.76–1.27)
GFR calc Af Amer: 103 mL/min/{1.73_m2} (ref >59)
GFR calc non Af Amer: 89 mL/min/{1.73_m2} (ref >59)
Globulin, Total: 2.6 g/dL (ref 1.5–4.5)
Glucose: 88 mg/dL (ref 65–99)
Potassium: 4.4 mmol/L (ref 3.5–5.2)
Sodium: 137 mmol/L (ref 134–144)
Total Protein: 7.2 g/dL (ref 6.0–8.5)

## 2020-08-05 LAB — LIPID PANEL
Chol/HDL Ratio: 4.7 ratio (ref 0.0–5.0)
Cholesterol, Total: 221 mg/dL — ABNORMAL HIGH (ref 100–199)
HDL: 47 mg/dL (ref >39)
LDL Chol Calc (NIH): 149 mg/dL — ABNORMAL HIGH (ref 0–99)
Triglycerides: 140 mg/dL (ref 0–149)
VLDL Cholesterol Cal: 25 mg/dL (ref 5–40)

## 2020-08-05 LAB — HCV RNA QUANT RFLX ULTRA OR GENOTYP: HCV Quant Baseline: NOT DETECTED IU/mL

## 2020-08-05 LAB — MICROALBUMIN / CREATININE URINE RATIO
Creatinine, Urine: 207.3 mg/dL
Microalb/Creat Ratio: 5 mg/g creat (ref 0–29)
Microalbumin, Urine: 10.6 ug/mL

## 2020-08-05 LAB — HEMOGLOBIN A1C
Est. average glucose Bld gHb Est-mCnc: 143 mg/dL
Hgb A1c MFr Bld: 6.6 % — ABNORMAL HIGH (ref 4.8–5.6)

## 2020-08-05 MED ORDER — ATORVASTATIN CALCIUM 20 MG PO TABS: 20.0000 mg | ORAL_TABLET | Freq: Every day | ORAL | 3 refills | Status: DC

## 2020-08-06 ENCOUNTER — Telehealth: Payer: Self-pay

## 2020-08-06 NOTE — Telephone Encounter (Signed)
Patient name and DOB has been verified Patient was informed of lab results. Patient had no questions.  

## 2020-08-06 NOTE — Telephone Encounter (Signed)
-----   Message from Hoy Register, MD sent at 08/05/2020  5:51 PM EDT ----- A1c is great at 6.6 down from 12.7. Continue current regimen. Cholesterol is elevated. I have sent a medication for Cholesterol to his Pharmacy and he is advised to adhere to a low cholesterol diet.

## 2020-08-25 MED FILL — $LANTUS 100 UNITS/ML VIAL: 100 | 28 days supply | Qty: 10 | Fill #1

## 2020-10-20 ENCOUNTER — Ambulatory Visit: Payer: Self-pay | Attending: Family Medicine | Admitting: Family Medicine

## 2020-10-20 ENCOUNTER — Encounter: Payer: Self-pay | Admitting: Family Medicine

## 2020-10-20 ENCOUNTER — Other Ambulatory Visit: Payer: Self-pay

## 2020-10-20 VITALS — BP 125/77 | HR 63 | Ht 68.0 in | Wt 204.0 lb

## 2020-10-20 DIAGNOSIS — E1169 Type 2 diabetes mellitus with other specified complication: Secondary | ICD-10-CM

## 2020-10-20 DIAGNOSIS — T466X5A Adverse effect of antihyperlipidemic and antiarteriosclerotic drugs, initial encounter: Secondary | ICD-10-CM | POA: Insufficient documentation

## 2020-10-20 DIAGNOSIS — E785 Hyperlipidemia, unspecified: Secondary | ICD-10-CM | POA: Insufficient documentation

## 2020-10-20 DIAGNOSIS — Z794 Long term (current) use of insulin: Secondary | ICD-10-CM | POA: Insufficient documentation

## 2020-10-20 DIAGNOSIS — Z789 Other specified health status: Secondary | ICD-10-CM

## 2020-10-20 DIAGNOSIS — E119 Type 2 diabetes mellitus without complications: Secondary | ICD-10-CM | POA: Insufficient documentation

## 2020-10-20 DIAGNOSIS — E11649 Type 2 diabetes mellitus with hypoglycemia without coma: Secondary | ICD-10-CM

## 2020-10-20 DIAGNOSIS — Z79899 Other long term (current) drug therapy: Secondary | ICD-10-CM | POA: Insufficient documentation

## 2020-10-20 LAB — GLUCOSE, POCT (MANUAL RESULT ENTRY): POC Glucose: 130 mg/dl — AB (ref 70–99)

## 2020-10-20 MED ORDER — INSULIN GLARGINE 100 UNIT/ML ~~LOC~~ SOLN: 10.0000 [IU] | Freq: Every day | SUBCUTANEOUS | 6 refills | Status: DC

## 2020-10-20 MED FILL — $LANTUS 100 UNITS/ML VIAL: 100 | 28 days supply | Qty: 10 | Fill #0

## 2020-10-20 NOTE — Progress Notes (Signed)
Subjective:  Patient ID: Juan Friedman, male    DOB: 1991/05/24  Age: 29 y.o. MRN: [id number]  CC: Diabetes ( )   HPI Johari Portela is a 29 year old male diagnosed with type 2 diabetes mellitus in 04/2020 (A1c 6.6), hyperlipidemia who presents today for follow-up visit. A1c 6.6 down from 12.7 previously Blood sugars are between 70-90. He has had 2 episodes of hypoglycemia but was asymptomatic.  Exercises regularly and is working on a diabetic diet. Complains of excruciating headaches and nausea with atorvastatin and symptoms resolved when he discontinued it. He has no additional concerns today. Past Medical History:  Diagnosis Date  . Diabetes mellitus without complication ([place])   . Seasonal allergies     Past Surgical History:  Procedure Laterality Date  . ROOT CANAL    . WISDOM TOOTH EXTRACTION      Family History  Problem Relation Age of Onset  . Diabetes Maternal Grandmother   . Diabetes Paternal Grandmother     No Known Allergies  Outpatient Medications Prior to Visit  Medication Sig Dispense Refill  . blood glucose meter kit and supplies KIT Dispense based on patient and insurance preference. Use up to four times daily as directed. (FOR ICD-9 250.00, 250.01). 1 each 0  . Insulin Syringe-Needle U-100 (TRUEPLUS INSULIN SYRINGE) 31G X 5/16" 0.3 ML MISC Use as instructed to inject Lantus daily. 100 each 2  . Needles & Syringes MISC For use with Lantus 100 each 6  . atorvastatin (LIPITOR) 20 MG tablet Take 1 tablet (20 mg total) by mouth daily. (Patient not taking: Reported on 10/20/2020) 30 tablet 3  . insulin glargine (LANTUS) 100 UNIT/ML injection Inject 0.1 mLs (10 Units total) into the skin daily. 10 mL 6   No facility-administered medications prior to visit.     ROS Review of Systems  Constitutional: Negative for activity change and appetite change.  HENT: Negative for sinus pressure and sore throat.   Eyes: Negative for visual disturbance.  Respiratory:  Negative for cough, chest tightness and shortness of breath.   Cardiovascular: Negative for chest pain and leg swelling.  Gastrointestinal: Negative for abdominal distention, abdominal pain, constipation and diarrhea.  Endocrine: Negative.   Genitourinary: Negative for dysuria.  Musculoskeletal: Negative for joint swelling and myalgias.  Skin: Negative for rash.  Allergic/Immunologic: Negative.   Neurological: Negative for weakness, light-headedness and numbness.  Psychiatric/Behavioral: Negative for dysphoric mood and suicidal ideas.    Objective:  BP 125/77   Pulse 63   Ht _0  (1.727 m)   Wt 204 lb (92.5 kg)   SpO2 98%   BMI 31.02 kg/m   BP/Weight 10/20/2020 07/20/2020 5/46/5681  Systolic BP 275 170 017  Diastolic BP 77 76 77  Wt. (Lbs) 204 197.2 -  BMI 31.02 29.98 -      Physical Exam Constitutional:      Appearance: He is well-developed.  Neck:     Vascular: No JVD.  Cardiovascular:     Rate and Rhythm: Normal rate.     Heart sounds: Normal heart sounds. No murmur heard.   Pulmonary:     Effort: Pulmonary effort is normal.     Breath sounds: Normal breath sounds. No wheezing or rales.  Chest:     Chest wall: No tenderness.  Abdominal:     General: Bowel sounds are normal. There is no distension.     Palpations: Abdomen is soft. There is no mass.     Tenderness: There is no abdominal tenderness.  Musculoskeletal:        General: Normal range of motion.     Right lower leg: No edema.     Left lower leg: No edema.  Neurological:     Mental Status: He is alert and oriented to person, place, and time.  Psychiatric:        Mood and Affect: Mood normal.     CMP Latest Ref Rng & Units 08/03/2020 04/30/2020 03/12/2020  Glucose 65 - 99 mg/dL 88 300(H) 180(H)  BUN 6 - 20 mg/dL _0 Creatinine 0.76 - 1.27 mg/dL 1.11 1.37(H) 1.41(H)  Sodium 134 - 144 mmol/L 137 134(L) 138  Potassium 3.5 - 5.2 mmol/L 4.4 4.3 4.2  Chloride 96 - 106 mmol/L 100 101 103  CO2 20  - 29 mmol/L 25 18(L) 23  Calcium 8.7 - 10.2 mg/dL 9.6 9.5 8.9  Total Protein 6.0 - 8.5 g/dL 7.2 7.8 7.2  Total Bilirubin 0.0 - 1.2 mg/dL 1.1 2.3(H) 1.5(H)  Alkaline Phos 44 - 121 IU/L 39(L) 46 37(L)  AST 0 - 40 IU/L 13 14(L) 28  ALT 0 - 44 IU/L 16 16 45(H)    Lipid Panel     Component Value Date/Time   CHOL 221 (H) 08/03/2020 1110   TRIG 140 08/03/2020 1110   HDL 47 08/03/2020 1110   CHOLHDL 4.7 08/03/2020 1110   LDLCALC 149 (H) 08/03/2020 1110    CBC    Component Value Date/Time   WBC 7.7 04/30/2020 1806   RBC 5.29 04/30/2020 1806   HGB 16.1 04/30/2020 1806   HCT 46.3 04/30/2020 1806   PLT 296 04/30/2020 1806   MCV 87.5 04/30/2020 1806   MCH 30.4 04/30/2020 1806   MCHC 34.8 04/30/2020 1806   RDW 13.1 04/30/2020 1806    Lab Results  Component Value Date   HGBA1C 6.6 (H) 08/03/2020    Assessment & Plan:  1. Type 2 diabetes mellitus with hypoglycemia without coma, with long-term current use of insulin (HCC) Controlled with A1c of 6.6; goal is less than 7.0 Congratulated on significant improvement in A1c which has trended down from 12.7-6.6 Discussed management of hypoglycemia including reducing Lantus by 2 units if this occurs - POCT glucose (manual entry) - insulin glargine (LANTUS) 100 UNIT/ML injection; Inject 0.1 mLs (10 Units total) into the skin daily.  Dispense: 10 mL; Refill: 6  2. Hyperlipidemia associated with type 2 diabetes mellitus ([place]) Uncontrolled Unable to tolerate atorvastatin We will check lipid panel at next visit and if still elevated consider initiating low intensity statin  3. Statin intolerance See #2 above   Meds ordered this encounter  Medications  . insulin glargine (LANTUS) 100 UNIT/ML injection    Sig: Inject 0.1 mLs (10 Units total) into the skin daily.    Dispense:  10 mL    Refill:  6    Follow-up: Return in about 3 months (around 01/18/2021) for Diabetes.       Charlott Rakes, MD, FAAFP. [facility]  and [place], [place]   10/20/2020, 10:03 AM

## 2020-10-20 NOTE — Patient Instructions (Signed)
Hypoglycemia °Hypoglycemia is when the sugar (glucose) level in your blood is too low. Signs of low blood sugar may include: °· Feeling: °? Hungry. °? Worried or nervous (anxious). °? Sweaty and clammy. °? Confused. °? Dizzy. °? Sleepy. °? Sick to your stomach (nauseous). °· Having: °? A fast heartbeat. °? A headache. °? A change in your vision. °? Tingling or no feeling (numbness) around your mouth, lips, or tongue. °? Jerky movements that you cannot control (seizure). °· Having trouble with: °? Moving (coordination). °? Sleeping. °? Passing out (fainting). °? Getting upset easily (irritability). °Low blood sugar can happen to people who have diabetes and people who do not have diabetes. Low blood sugar can happen quickly, and it can be an emergency. °Treating low blood sugar °Low blood sugar is often treated by eating or drinking something sugary right away, such as: °· Fruit juice, 4-6 oz (120-150 mL). °· Regular soda (not diet soda), 4-6 oz (120-150 mL). °· Low-fat milk, 4 oz (120 mL). °· Several pieces of hard candy. °· Sugar or honey, 1 Tbsp (15 mL). °Treating low blood sugar if you have diabetes °If you can think clearly and swallow safely, follow the 15:15 rule: °· Take 15 grams of a fast-acting carb (carbohydrate). Talk with your doctor about how much you should take. °· Always keep a source of fast-acting carb with you, such as: °? Sugar tablets (glucose pills). Take 3-4 pills. °? 6-8 pieces of hard candy. °? 4-6 oz (120-150 mL) of fruit juice. °? 4-6 oz (120-150 mL) of regular (not diet) soda. °? 1 Tbsp (15 mL) honey or sugar. °· Check your blood sugar 15 minutes after you take the carb. °· If your blood sugar is still at or below 70 mg/dL (3.9 mmol/L), take 15 grams of a carb again. °· If your blood sugar does not go above 70 mg/dL (3.9 mmol/L) after 3 tries, get help right away. °· After your blood sugar goes back to normal, eat a meal or a snack within 1 hour. ° °Treating very low blood sugar °If your  blood sugar is at or below 54 mg/dL (3 mmol/L), you have very low blood sugar (severe hypoglycemia). This may also cause: °· Passing out. °· Jerky movements you cannot control (seizure). °· Losing consciousness (coma). °This is an emergency. Do not wait to see if the symptoms will go away. Get medical help right away. Call your local emergency services (911 in the [place] Do not drive yourself to the hospital. °If you have very low blood sugar and you cannot eat or drink, you may need a glucagon shot (injection). A family member or friend should learn how to check your blood sugar and how to give you a glucagon shot. Ask your doctor if you need to have a glucagon shot kit at home. °Follow these instructions at home: °General instructions °· Take over-the-counter and prescription medicines only as told by your doctor. °· Stay aware of your blood sugar as told by your doctor. °· Limit alcohol intake to no more than 1 drink a day for nonpregnant women and 2 drinks a day for men. One drink equals 12 oz of beer (355 mL), 5 oz of wine (148 mL), or 1½ oz of hard liquor (44 mL). °· Keep all follow-up visits as told by your doctor. This is important. °If you have diabetes: ° °· Follow your diabetes care plan as told by your doctor. Make sure you: °? Know the signs of low blood sugar. °?   Take your medicines as told. ? Follow your exercise and meal plan. ? Eat on time. Do not skip meals. ? Check your blood sugar as often as told by your doctor. Always check it before and after exercise. ? Follow your sick day plan when you cannot eat or drink normally. Make this plan ahead of time with your doctor.  Share your diabetes care plan with: ? Your work or school. ? People you live with.  Check your pee (urine) for ketones: ? When you are sick. ? As told by your doctor.  Carry a card or wear jewelry that says you have diabetes. Contact a doctor if:  You have trouble keeping your blood sugar in your target  range.  You have low blood sugar often. Get help right away if:  You still have symptoms after you eat or drink something sugary.  Your blood sugar is at or below 54 mg/dL (3 mmol/L).  You have jerky movements that you cannot control.  You pass out. These symptoms may be an emergency. Do not wait to see if the symptoms will go away. Get medical help right away. Call your local emergency services (911 in the [place] Do not drive yourself to the hospital. Summary  Hypoglycemia happens when the level of sugar (glucose) in your blood is too low.  Low blood sugar can happen to people who have diabetes and people who do not have diabetes. Low blood sugar can happen quickly, and it can be an emergency.  Make sure you know the signs of low blood sugar and know how to treat it.  Always keep a source of sugar (fast-acting carb) with you to treat low blood sugar. This information is not intended to replace advice given to you by your health care provider. Make sure you discuss any questions you have with your health care provider. Document Revised: 01/30/2019 Document Reviewed: 11/12/2015 Elsevier Patient Education  2020 [employer] Inc[employer]  

## 2020-10-20 NOTE — Progress Notes (Signed)
Had headaches and abdominla pain while taking atorvastatin.

## 2020-11-02 MED FILL — TRUEPLUS SYR 0.3ML 31GX5/16: 31G X 5/16" | 90 days supply | Qty: 100 | Fill #1

## 2020-11-02 MED FILL — $LANTUS 100 UNITS/ML VIAL: 100 | 28 days supply | Qty: 10 | Fill #0

## 2020-12-11 IMAGING — CT CT NECK W/ CM
4 of 5 series · 16 of 34 positions shown, 18 images · IV contrast (APPLIED)
Comparison: None.

CLINICAL DATA: Sore throat. Focal change. Pharyngeal swelling.
Symptoms began suddenly.

EXAM:
CT NECK WITH CONTRAST
TECHNIQUE: Multidetector CT imaging of the neck was performed using the
standard protocol following the bolus administration of intravenous
contrast.
CONTRAST:  75mL OMNIPAQUE IOHEXOL 300 MG/ML  SOLN

[Series 3: neck 2.0 i31s 3 · axial · 0.39mm/px · z∈[-313,-175]mm · 4 of 115 slices shown, 5 images]
[im 23/115  soft-tissue]
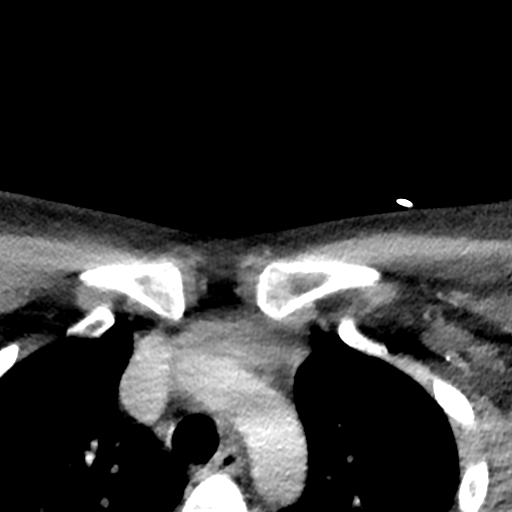
[im 23/115  bone]
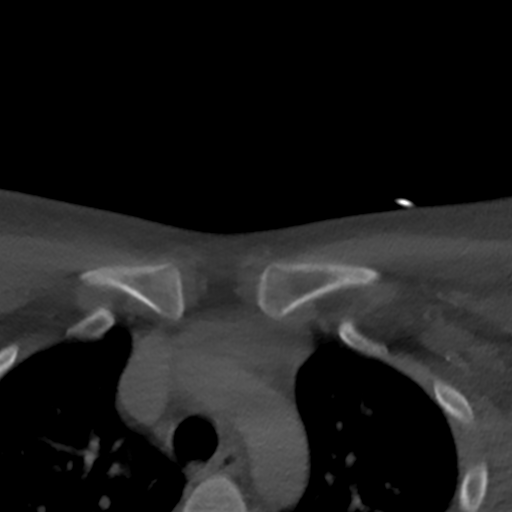
[im 46/115  bone]
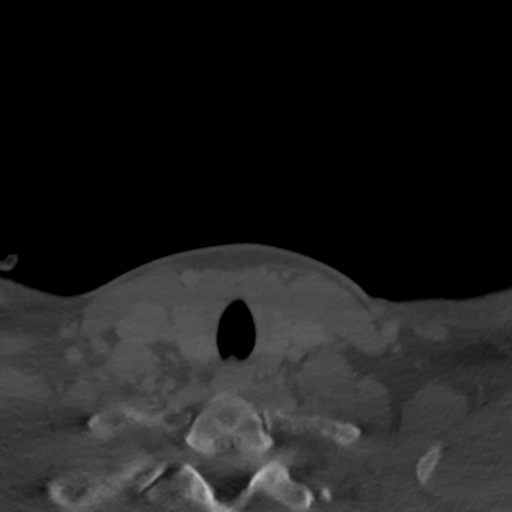
[im 69/115  bone]
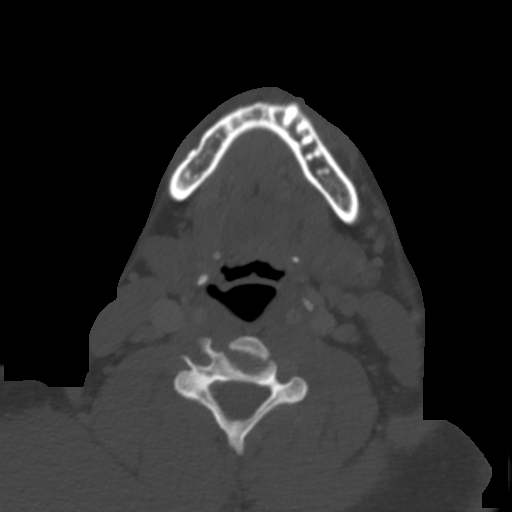
[im 92/115  bone]
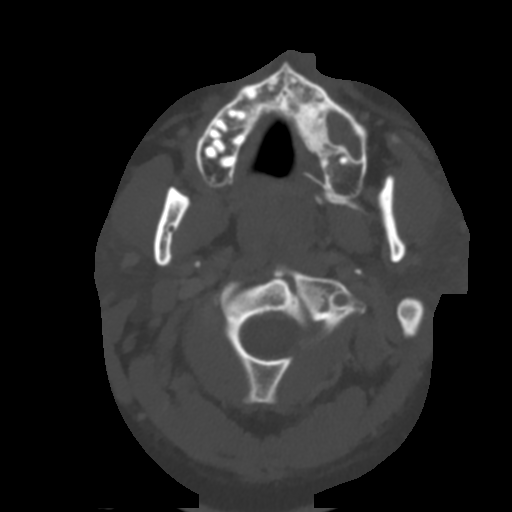

[Series 6: coronal st · coronal · 0.36mm/px · 3 of 84 slices shown]
[im 23/84  bone]
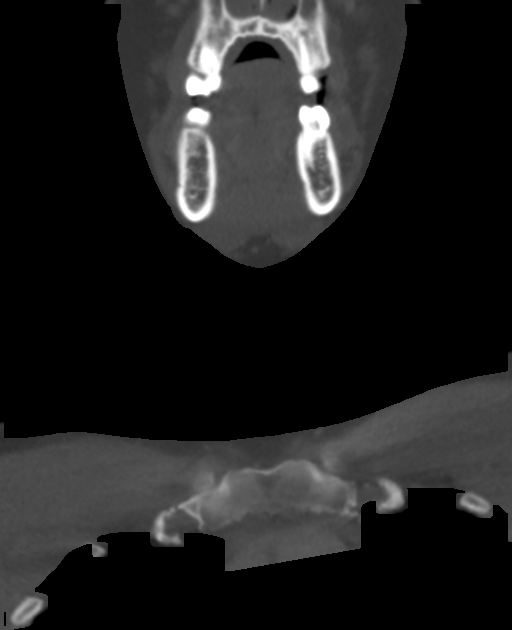
[im 36/84  bone]
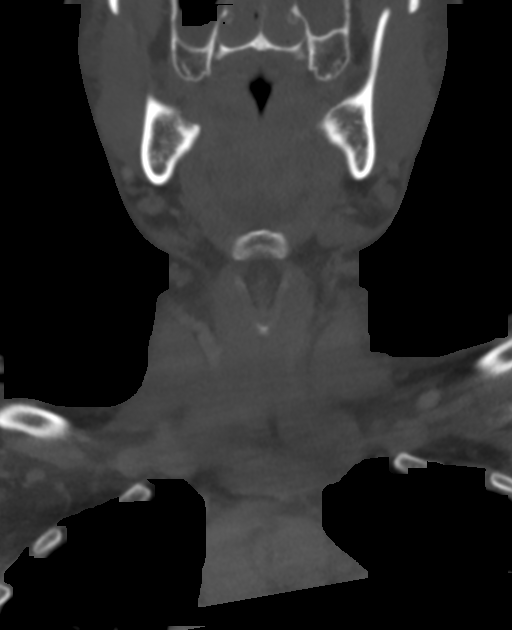
[im 48/84  bone]
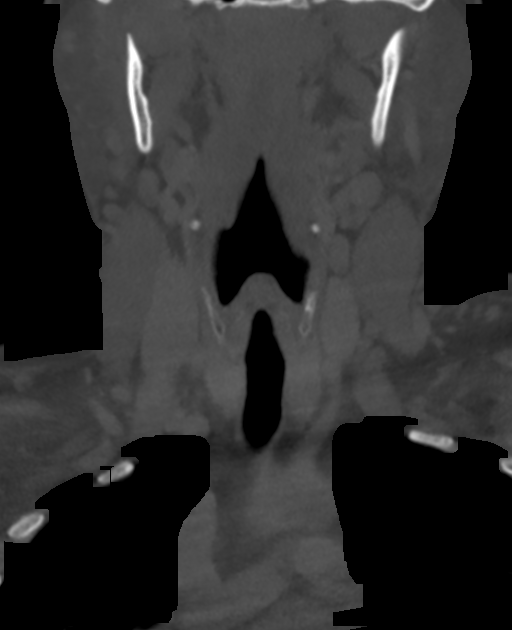

[Series 7: sagittal st · sagittal · 0.39mm/px · 5 of 101 slices shown, 6 images]
[im 34/101  bone]
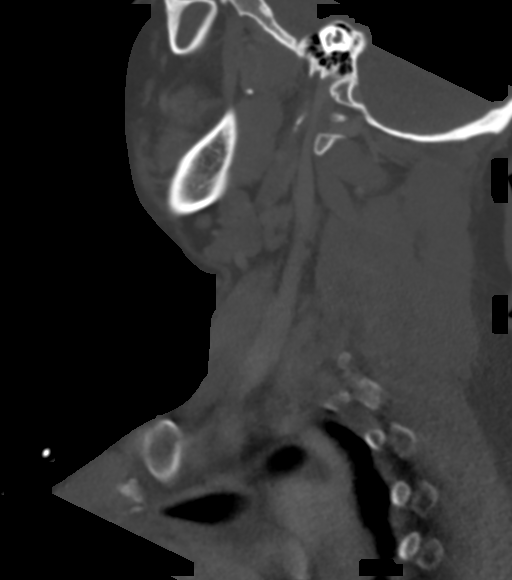
[im 42/101  bone]
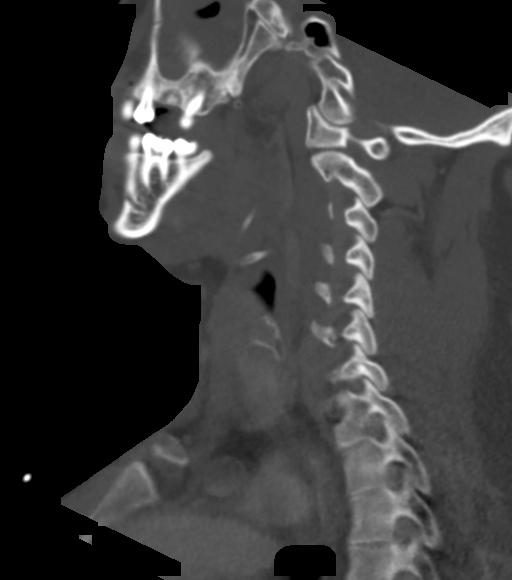
[im 51/101  soft-tissue]
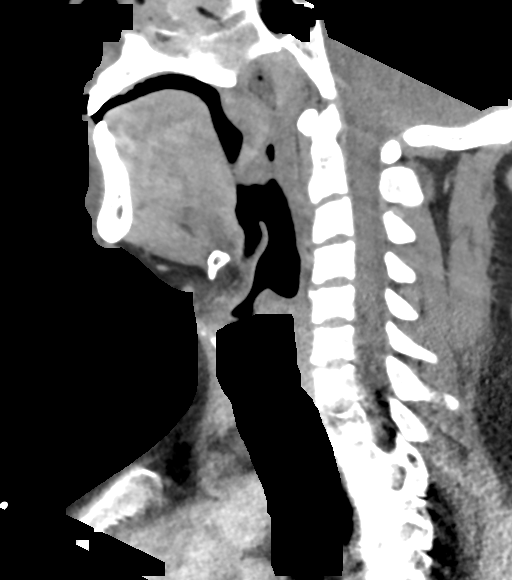
[im 51/101  bone]
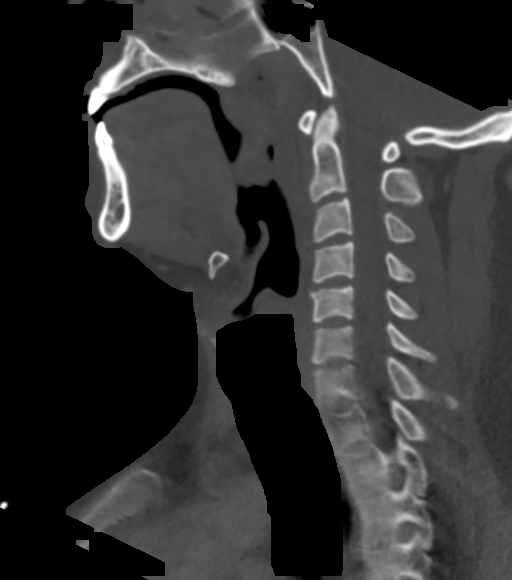
[im 59/101  bone]
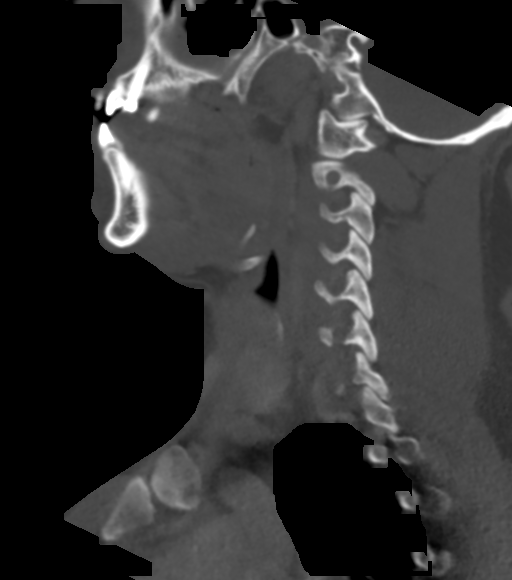
[im 67/101  bone]
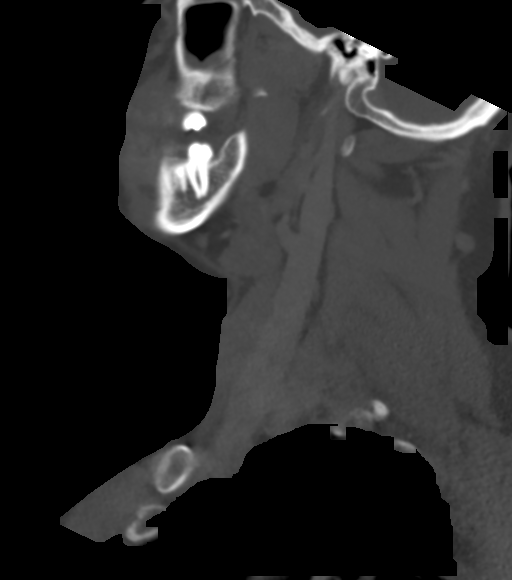

[Series 8: orthogonal st · axial · 0.39mm/px · z∈[-316,-195]mm · 4 of 113 slices shown]
[im 23/113  bone]
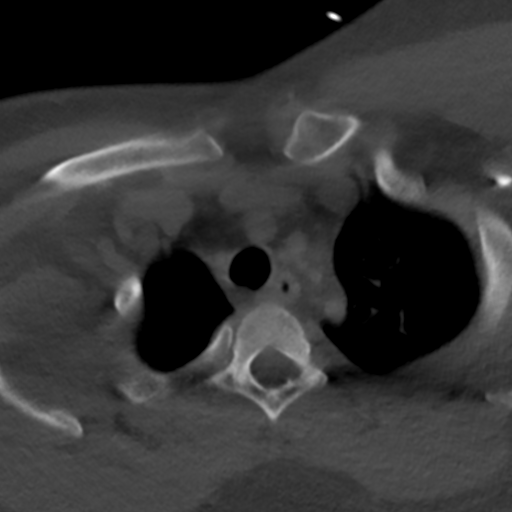
[im 45/113  bone]
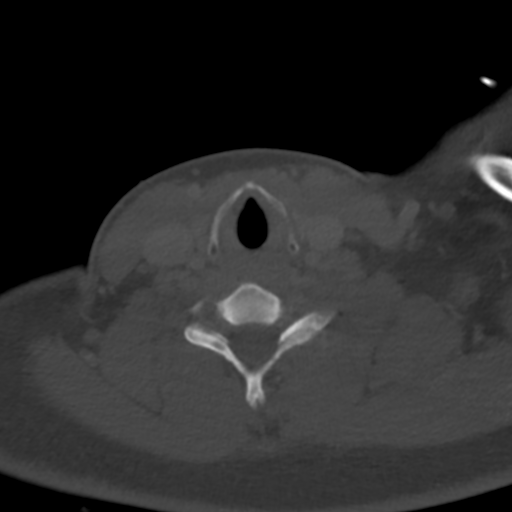
[im 68/113  bone]
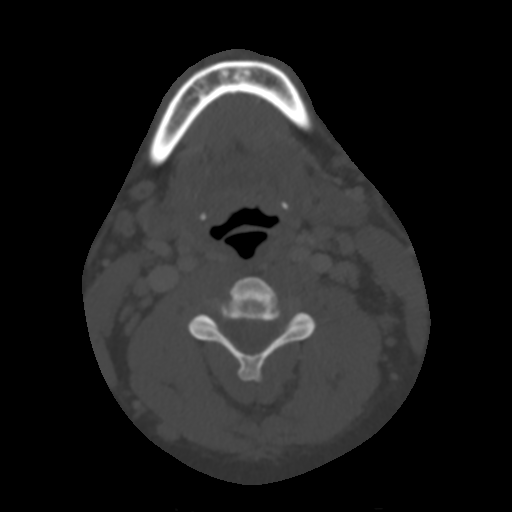
[im 90/113  bone]
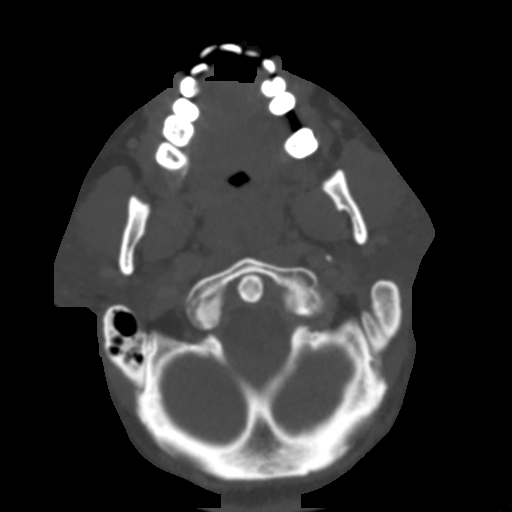

[16 of 34 positions shown; findings below may reference images not displayed]

FINDINGS: Pharynx and larynx: There appears to be mucosal prominence of the
pharynx suggesting pharyngitis. The epiglottis appears normal. No
evidence of peritonsillar abscess. No retropharyngeal fluid. Larynx
is self appears normal without evidence of cord asymmetry.

Salivary glands: Parotid and submandibular glands are normal.

Thyroid: Normal

Lymph nodes: Slight bilateral nodal prominence likely reactive to
inflammatory disease. No suppuration.

Vascular: No abnormal vascular finding.

Limited intracranial: Normal

Visualized orbits: Normal

Mastoids and visualized paranasal sinuses: Opacified left maxillary
sinus. Scattered opacified ethmoid air cells.

Skeleton: Normal

Upper chest: Normal

Other: None
IMPRESSION: Mucosal prominence in the pharynx suggesting pharyngitis. No
evidence of retropharyngeal abscess, peritonsillar abscess or
epiglottitis.

Mild bilateral reactive nodal prominence without suppuration.

## 2021-01-20 ENCOUNTER — Encounter: Payer: Self-pay | Admitting: Family Medicine

## 2021-01-20 ENCOUNTER — Ambulatory Visit: Payer: Self-pay | Attending: Family Medicine | Admitting: Family Medicine

## 2021-01-20 ENCOUNTER — Other Ambulatory Visit: Payer: Self-pay

## 2021-01-20 VITALS — BP 128/85 | HR 70 | Ht 69.0 in | Wt 204.4 lb

## 2021-01-20 DIAGNOSIS — Z794 Long term (current) use of insulin: Secondary | ICD-10-CM

## 2021-01-20 DIAGNOSIS — E11649 Type 2 diabetes mellitus with hypoglycemia without coma: Secondary | ICD-10-CM

## 2021-01-20 LAB — POCT GLYCOSYLATED HEMOGLOBIN (HGB A1C): Hemoglobin A1C: 7.2 % — AB (ref 4.0–5.6)

## 2021-01-20 LAB — GLUCOSE, POCT (MANUAL RESULT ENTRY): POC Glucose: 263 mg/dl — AB (ref 70–99)

## 2021-01-20 MED ORDER — INSULIN GLARGINE 100 UNIT/ML ~~LOC~~ SOLN: 12.0000 [IU] | Freq: Every day | SUBCUTANEOUS | 6 refills | Status: DC

## 2021-01-20 NOTE — Progress Notes (Signed)
Subjective:  Patient ID: Juan Friedman, male    DOB: Aug 08, 1991  Age: 30 y.o. MRN: [id number]  CC: Diabetes   HPI Sanuel Hackler is a 30 year old male diagnosed with type 2 diabetes mellitus in 04/2020 (A1c 7.2), hyperlipidemia who presents today for follow-up visit. A1c is 7.2 slightly up from 6.6 previously and he has been compliant with 10 units of Lantus. Blood sugars are 100-140 fasting.  He continues to adhere to a diabetic diet and a good exercise regimen.  He has noticed nocturia which does not occur when he uses apple cider vinegar as directed by his mom but symptoms occur when he does not drink apple cider vinegar.  It has been occurring over the last month.  He has no dysuria, daytime frequency, hematuria.   Past Medical History:  Diagnosis Date  . Diabetes mellitus without complication ([place])   . Seasonal allergies     Past Surgical History:  Procedure Laterality Date  . ROOT CANAL    . WISDOM TOOTH EXTRACTION      Family History  Problem Relation Age of Onset  . Diabetes Maternal Grandmother   . Diabetes Paternal Grandmother     No Known Allergies  Outpatient Medications Prior to Visit  Medication Sig Dispense Refill  . blood glucose meter kit and supplies KIT Dispense based on patient and insurance preference. Use up to four times daily as directed. (FOR ICD-9 250.00, 250.01). 1 each 0  . Insulin Syringe-Needle U-100 (TRUEPLUS INSULIN SYRINGE) 31G X 5/16" 0.3 ML MISC Use as instructed to inject Lantus daily. 100 each 2  . Needles & Syringes MISC For use with Lantus 100 each 6  . insulin glargine (LANTUS) 100 UNIT/ML injection Inject 0.1 mLs (10 Units total) into the skin daily. 10 mL 6   No facility-administered medications prior to visit.     ROS Review of Systems  Constitutional: Negative for activity change and appetite change.  HENT: Negative for sinus pressure and sore throat.   Eyes: Negative for visual disturbance.  Respiratory: Negative for  cough, chest tightness and shortness of breath.   Cardiovascular: Negative for chest pain and leg swelling.  Gastrointestinal: Negative for abdominal distention, abdominal pain, constipation and diarrhea.  Endocrine: Negative.   Genitourinary: Negative for dysuria.  Musculoskeletal: Negative for joint swelling and myalgias.  Skin: Negative for rash.  Allergic/Immunologic: Negative.   Neurological: Negative for weakness, light-headedness and numbness.  Psychiatric/Behavioral: Negative for dysphoric mood and suicidal ideas.    Objective:  BP 128/85   Pulse 70   Ht '5\' 9"'  (1.753 m)   Wt 204 lb 6.4 oz (92.7 kg)   SpO2 96%   BMI 30.18 kg/m   BP/Weight 01/20/2021 10/20/2020 7/90/2409  Systolic BP 735 329 924  Diastolic BP 85 77 76  Wt. (Lbs) 204.4 204 197.2  BMI 30.18 31.02 29.98      Physical Exam Constitutional:      Appearance: He is well-developed.  Neck:     Vascular: No JVD.  Cardiovascular:     Rate and Rhythm: Normal rate.     Heart sounds: Normal heart sounds. No murmur heard.   Pulmonary:     Effort: Pulmonary effort is normal.     Breath sounds: Normal breath sounds. No wheezing or rales.  Chest:     Chest wall: No tenderness.  Abdominal:     General: Bowel sounds are normal. There is no distension.     Palpations: Abdomen is soft. There is no mass.  Tenderness: There is no abdominal tenderness.  Musculoskeletal:        General: Normal range of motion.     Right lower leg: No edema.     Left lower leg: No edema.  Neurological:     Mental Status: He is alert and oriented to person, place, and time.  Psychiatric:        Mood and Affect: Mood normal.     CMP Latest Ref Rng & Units 08/03/2020 04/30/2020 03/12/2020  Glucose 65 - 99 mg/dL 88 300(H) 180(H)  BUN 6 - 20 mg/dL '13 11 14  ' Creatinine 0.76 - 1.27 mg/dL 1.11 1.37(H) 1.41(H)  Sodium 134 - 144 mmol/L 137 134(L) 138  Potassium 3.5 - 5.2 mmol/L 4.4 4.3 4.2  Chloride 96 - 106 mmol/L 100 101 103  CO2  20 - 29 mmol/L 25 18(L) 23  Calcium 8.7 - 10.2 mg/dL 9.6 9.5 8.9  Total Protein 6.0 - 8.5 g/dL 7.2 7.8 7.2  Total Bilirubin 0.0 - 1.2 mg/dL 1.1 2.3(H) 1.5(H)  Alkaline Phos 44 - 121 IU/L 39(L) 46 37(L)  AST 0 - 40 IU/L 13 14(L) 28  ALT 0 - 44 IU/L 16 16 45(H)    Lipid Panel     Component Value Date/Time   CHOL 221 (H) 08/03/2020 1110   TRIG 140 08/03/2020 1110   HDL 47 08/03/2020 1110   CHOLHDL 4.7 08/03/2020 1110   LDLCALC 149 (H) 08/03/2020 1110    CBC    Component Value Date/Time   WBC 7.7 04/30/2020 1806   RBC 5.29 04/30/2020 1806   HGB 16.1 04/30/2020 1806   HCT 46.3 04/30/2020 1806   PLT 296 04/30/2020 1806   MCV 87.5 04/30/2020 1806   MCH 30.4 04/30/2020 1806   MCHC 34.8 04/30/2020 1806   RDW 13.1 04/30/2020 1806    Lab Results  Component Value Date   HGBA1C 7.2 (A) 01/20/2021    Assessment & Plan:  1. Type 2 diabetes mellitus with hypoglycemia without coma, with long-term current use of insulin (HCC) Controlled with A1c of 7.2; goal is less than 7.0 Given elevated fasting sugars will increase Lantus by 2 units.  He has been advised to uptitrate Lantus depending on blood sugars until he is at goal. Counseled on Diabetic diet, my plate method, 244 minutes of moderate intensity exercise/week Blood sugar logs with fasting goals of 80-120 mg/dl, random of less than 180 and in the event of sugars less than 60 mg/dl or greater than 400 mg/dl encouraged to notify the clinic. Advised on the need for annual eye exams, annual foot exams, Pneumonia vaccine. - POCT glucose (manual entry) - POCT glycosylated hemoglobin (Hb A1C) - insulin glargine (LANTUS) 100 UNIT/ML injection; Inject 0.12 mLs (12 Units total) into the skin daily.  Dispense: 30 mL; Refill: 6 - CMP14+EGFR; Future - Lipid panel; Future   Meds ordered this encounter  Medications  . insulin glargine (LANTUS) 100 UNIT/ML injection    Sig: Inject 0.12 mLs (12 Units total) into the skin daily.    Dispense:   30 mL    Refill:  6    Dose increase    Follow-up: Return in about 6 months (around 07/22/2021) for disease management.       Charlott Rakes, MD, FAAFP. [facility] and [place], [place]   01/20/2021, 12:28 PM

## 2021-01-20 NOTE — Patient Instructions (Signed)
Diabetes Mellitus and Exercise Exercising regularly is important for overall health, especially for people who have diabetes mellitus. Exercising is not only about losing weight. It has many other health benefits, such as increasing muscle strength and bone density and reducing body fat and stress. This leads to improved fitness, flexibility, and endurance, all of which result in better overall health. What are the benefits of exercise if I have diabetes? Exercise has many benefits for people with diabetes. They include:  Helping to lower and control blood sugar (glucose).  Helping the body to respond better to the hormone insulin by improving insulin sensitivity.  Reducing how much insulin the body needs.  Lowering the risk for heart disease by: ? Lowering "bad" cholesterol and triglyceride levels. ? Increasing "good" cholesterol levels. ? Lowering blood pressure. ? Lowering blood glucose levels. What is my activity plan? Your health care provider or certified diabetes educator can help you make a plan for the type and frequency of exercise that works for you. This is called your activity plan. Be sure to:  Get at least 150 minutes of medium-intensity or high-intensity exercise each week. Exercises may include brisk walking, biking, or water aerobics.  Do stretching and strengthening exercises, such as yoga or weight lifting, at least 2 times a week.  Spread out your activity over at least 3 days of the week.  Get some form of physical activity each day. ? Do not go more than 2 days in a row without some kind of physical activity. ? Avoid being inactive for more than 90 minutes at a time. Take frequent breaks to walk or stretch.  Choose exercises or activities that you enjoy. Set realistic goals.  Start slowly and gradually increase your exercise intensity over time.   How do I manage my diabetes during exercise? Monitor your blood glucose  Check your blood glucose before and  after exercising. If your blood glucose is: ? 240 mg/dL (13.3 mmol/L) or higher before you exercise, check your urine for ketones. These are chemicals created by the liver. If you have ketones in your urine, do not exercise until your blood glucose returns to normal. ? 100 mg/dL (5.6 mmol/L) or lower, eat a snack containing 15-20 grams of carbohydrate. Check your blood glucose 15 minutes after the snack to make sure that your glucose level is above 100 mg/dL (5.6 mmol/L) before you start your exercise.  Know the symptoms of low blood glucose (hypoglycemia) and how to treat it. Your risk for hypoglycemia increases during and after exercise. Follow these tips and your health care provider's instructions  Keep a carbohydrate snack that is fast-acting for use before, during, and after exercise to help prevent or treat hypoglycemia.  Avoid injecting insulin into areas of the body that are going to be exercised. For example, avoid injecting insulin into: ? Your arms, when you are about to play tennis. ? Your legs, when you are about to go jogging.  Keep records of your exercise habits. Doing this can help you and your health care provider adjust your diabetes management plan as needed. Write down: ? Food that you eat before and after you exercise. ? Blood glucose levels before and after you exercise. ? The type and amount of exercise you have done.  Work with your health care provider when you start a new exercise or activity. He or she may need to: ? Make sure that the activity is safe for you. ? Adjust your insulin, other medicines, and food that   you eat.  Drink plenty of water while you exercise. This prevents loss of water (dehydration) and problems caused by a lot of heat in the body (heat stroke).   Where to find more information  American Diabetes Association: www.diabetes.org Summary  Exercising regularly is important for overall health, especially for people who have diabetes  mellitus.  Exercising has many health benefits. It increases muscle strength and bone density and reduces body fat and stress. It also lowers and controls blood glucose.  Your health care provider or certified diabetes educator can help you make an activity plan for the type and frequency of exercise that works for you.  Work with your health care provider to make sure any new activity is safe for you. Also work with your health care provider to adjust your insulin, other medicines, and the food you eat. This information is not intended to replace advice given to you by your health care provider. Make sure you discuss any questions you have with your health care provider. Document Revised: 07/07/2019 Document Reviewed: 07/07/2019 Elsevier Patient Education  2021 Elsevier Inc.  

## 2021-01-24 ENCOUNTER — Ambulatory Visit: Payer: Self-pay | Attending: Family Medicine

## 2021-01-24 ENCOUNTER — Other Ambulatory Visit: Payer: Self-pay

## 2021-01-24 DIAGNOSIS — Z794 Long term (current) use of insulin: Secondary | ICD-10-CM

## 2021-01-24 DIAGNOSIS — E11649 Type 2 diabetes mellitus with hypoglycemia without coma: Secondary | ICD-10-CM

## 2021-01-25 ENCOUNTER — Encounter: Payer: Self-pay | Admitting: Family Medicine

## 2021-01-25 ENCOUNTER — Other Ambulatory Visit: Payer: Self-pay | Admitting: Family Medicine

## 2021-01-25 LAB — LIPID PANEL
Chol/HDL Ratio: 5.6 ratio — ABNORMAL HIGH (ref 0.0–5.0)
Cholesterol, Total: 239 mg/dL — ABNORMAL HIGH (ref 100–199)
HDL: 43 mg/dL (ref >39)
LDL Chol Calc (NIH): 174 mg/dL — ABNORMAL HIGH (ref 0–99)
Triglycerides: 119 mg/dL (ref 0–149)
VLDL Cholesterol Cal: 22 mg/dL (ref 5–40)

## 2021-01-25 LAB — CMP14+EGFR
ALT: 29 IU/L (ref 0–44)
AST: 21 IU/L (ref 0–40)
Albumin/Globulin Ratio: 1.9 (ref 1.2–2.2)
Albumin: 4.7 g/dL (ref 4.1–5.2)
Alkaline Phosphatase: 40 IU/L — ABNORMAL LOW (ref 44–121)
BUN/Creatinine Ratio: 15 (ref 9–20)
BUN: 18 mg/dL (ref 6–20)
Bilirubin Total: 1.3 mg/dL — ABNORMAL HIGH (ref 0.0–1.2)
CO2: 23 mmol/L (ref 20–29)
Calcium: 9.3 mg/dL (ref 8.7–10.2)
Chloride: 98 mmol/L (ref 96–106)
Creatinine, Ser: 1.2 mg/dL (ref 0.76–1.27)
Globulin, Total: 2.5 g/dL (ref 1.5–4.5)
Glucose: 186 mg/dL — ABNORMAL HIGH (ref 65–99)
Potassium: 4.6 mmol/L (ref 3.5–5.2)
Sodium: 137 mmol/L (ref 134–144)
Total Protein: 7.2 g/dL (ref 6.0–8.5)
eGFR: 83 mL/min/{1.73_m2} (ref >59)

## 2021-01-25 MED ORDER — ATORVASTATIN CALCIUM 20 MG PO TABS
20.0000 mg | ORAL_TABLET | Freq: Every day | ORAL | 6 refills | Status: AC
Start: 2021-01-25 — End: ?

## 2021-02-15 MED FILL — Insulin Syringe/Needle U-100 0.3 ML 31 x 5/16": 25 days supply | Qty: 100 | Fill #0 | Status: AC

## 2021-02-16 ENCOUNTER — Other Ambulatory Visit: Payer: Self-pay

## 2021-02-21 ENCOUNTER — Other Ambulatory Visit: Payer: Self-pay

## 2021-02-21 MED ORDER — INSULIN GLARGINE 100 UNIT/ML ~~LOC~~ SOLN
SUBCUTANEOUS | 6 refills | Status: DC
  Filled 2021-02-21 (×2): qty 10, 28d supply, fill #0
  Filled 2021-05-13 – 2021-05-17 (×2): qty 10, 28d supply, fill #1

## 2021-05-13 ENCOUNTER — Other Ambulatory Visit: Payer: Self-pay

## 2021-05-13 ENCOUNTER — Other Ambulatory Visit: Payer: Self-pay | Admitting: Family Medicine

## 2021-05-13 MED ORDER — "INSULIN SYRINGE-NEEDLE U-100 31G X 5/16"" 0.3 ML MISC"
2 refills | Status: AC
  Filled 2021-05-13: qty 100, 25d supply, fill #0
  Filled 2021-08-02: qty 100, 25d supply, fill #1
  Filled 2021-12-19: qty 100, 25d supply, fill #2
  Filled 2021-12-20: qty 100, 25d supply, fill #0

## 2021-05-17 ENCOUNTER — Other Ambulatory Visit: Payer: Self-pay

## 2021-05-24 ENCOUNTER — Other Ambulatory Visit: Payer: Self-pay

## 2021-06-09 ENCOUNTER — Encounter (HOSPITAL_BASED_OUTPATIENT_CLINIC_OR_DEPARTMENT_OTHER): Payer: Self-pay | Admitting: Obstetrics and Gynecology

## 2021-06-09 ENCOUNTER — Other Ambulatory Visit: Payer: Self-pay

## 2021-06-09 ENCOUNTER — Emergency Department (HOSPITAL_BASED_OUTPATIENT_CLINIC_OR_DEPARTMENT_OTHER)
Admission: EM | Admit: 2021-06-09 | Discharge: 2021-06-09 | Disposition: A | Discharge status: Home / Self Care | Payer: Medicaid Other | Attending: Emergency Medicine | Admitting: Emergency Medicine

## 2021-06-09 DIAGNOSIS — J0110 Acute frontal sinusitis, unspecified: Secondary | ICD-10-CM

## 2021-06-09 DIAGNOSIS — E119 Type 2 diabetes mellitus without complications: Secondary | ICD-10-CM | POA: Insufficient documentation

## 2021-06-09 DIAGNOSIS — Z794 Long term (current) use of insulin: Secondary | ICD-10-CM | POA: Insufficient documentation

## 2021-06-09 MED ORDER — CIPROFLOXACIN HCL 0.3 % OP SOLN: 1.0000 [drp] | OPHTHALMIC | 0 refills | Status: AC

## 2021-06-09 MED ORDER — AMOXICILLIN 500 MG PO CAPS: 500.0000 mg | ORAL_CAPSULE | Freq: Two times a day (BID) | ORAL | 0 refills | Status: AC

## 2021-06-09 NOTE — ED Notes (Signed)
This RN presented the AVS utilizing Teachback Method. Patient verbalizes understanding of Discharge Instructions. Opportunity for Questioning and Answers were provided. Patient Discharged from ED ambulatory to Home.   

## 2021-06-09 NOTE — ED Notes (Signed)
Patient states he would not like to be tested for COVID-19.

## 2021-06-09 NOTE — ED Provider Notes (Signed)
[facility] EMER[facility]ENC[facility] DEPT Provider Note   CSN: [id number] Arrival date & time: 06/09/21  1907     History Chief Complaint  Patient presents with  . URI    Juan Friedman is a 30 y.o. male.  The history is provided by the patient.  URI Presenting symptoms: congestion, ear pain, rhinorrhea and sore throat   Presenting symptoms: no cough and no fever   Severity:  Moderate Onset quality:  Gradual Duration:  3 days Timing:  Constant Progression:  Worsening Chronicity:  New Relieved by:  Nothing Worsened by:  Nothing Ineffective treatments:  None tried Associated symptoms: headaches and sinus pain   Associated symptoms: no arthralgias       Past Medical History:  Diagnosis Date  . Diabetes mellitus without complication ([place])   . Seasonal allergies     Patient Active Problem List   Diagnosis Date Noted  . Seasonal allergies     Past Surgical History:  Procedure Laterality Date  . ROOT CANAL    . WISDOM TOOTH EXTRACTION         Family History  Problem Relation Age of Onset  . Diabetes Maternal Grandmother   . Diabetes Paternal Grandmother     Social History   Tobacco Use  . Smoking status: Never  . Smokeless tobacco: Never  Vaping Use  . Vaping Use: Never used  Substance Use Topics  . Alcohol use: Never  . Drug use: Never    Home Medications Prior to Admission medications   Medication Sig Start Date End Date Taking? Authorizing Provider  amoxicillin (AMOXIL) 500 MG capsule Take 1 capsule (500 mg total) by mouth 2 (two) times daily for 5 days. 06/09/21 06/14/21 Yes Arnaldo Natal, MD  ciprofloxacin (CILOXAN) 0.3 % ophthalmic solution Place 1 drop into both eyes every 4 (four) hours while awake for 5 days. 06/09/21 06/14/21 Yes Arnaldo Natal, MD  atorvastatin (LIPITOR) 20 MG tablet Take 1 tablet (20 mg total) by mouth daily. 01/25/21   Charlott Rakes, MD  blood glucose meter kit and supplies KIT Dispense based on patient and insurance  preference. Use up to four times daily as directed. (FOR ICD-9 250.00, 250.01). 05/01/20   McDonald, Mia A, PA-C  insulin glargine (LANTUS) 100 UNIT/ML injection Inject 0.12 mLs (12 Units total) into the skin daily. 01/20/21 02/19/21  Charlott Rakes, MD  insulin glargine (LANTUS) 100 UNIT/ML injection INJECT 0.1 MLS (10 UNITS TOTAL) INTO THE SKIN DAILY. 10/20/20 10/20/21  Charlott Rakes, MD  Insulin Syringe-Needle U-100 (TRUEPLUS INSULIN SYRINGE) 31G X 5/16" 0.3 ML MISC USE AS INSTRUCTED TO INJECT LANTUS DAILY. 05/13/21 05/13/22  Charlott Rakes, MD  Needles & Syringes MISC For use with Lantus 07/20/20   Charlott Rakes, MD    Allergies    Patient has no known allergies.  Review of Systems   Review of Systems  Constitutional:  Negative for chills and fever.  HENT:  Positive for congestion, ear pain, rhinorrhea, sinus pain and sore throat.   Eyes:  Positive for discharge and redness. Negative for pain and visual disturbance.  Respiratory:  Negative for cough and shortness of breath.   Cardiovascular:  Negative for chest pain and palpitations.  Gastrointestinal:  Negative for abdominal pain and vomiting.  Genitourinary:  Negative for dysuria and hematuria.  Musculoskeletal:  Negative for arthralgias and back pain.  Skin:  Negative for color change and rash.  Neurological:  Positive for headaches. Negative for seizures and syncope.  All other systems reviewed and are  negative.  Physical Exam Updated Vital Signs BP (!) 127/93   Pulse 73   Temp 98 F (36.7 C) (Oral)   Resp 13   Ht '5\' 9"'  (1.753 m)   Wt 92.5 kg   SpO2 96%   BMI 30.13 kg/m   Physical Exam Vitals and nursing note reviewed.  Constitutional:      Appearance: Normal appearance.  HENT:     Head: Normocephalic and atraumatic.     Right Ear: Tympanic membrane, ear canal and external ear normal.     Left Ear: Tympanic membrane, ear canal and external ear normal.     Nose: Nose normal.     Mouth/Throat:     Mouth: Mucous  membranes are moist.     Pharynx: No oropharyngeal exudate or posterior oropharyngeal erythema.  Eyes:     Conjunctiva/sclera: Conjunctivae normal.     Comments: Yellow discharge Conjunctival injection bilaterally  Pulmonary:     Effort: Pulmonary effort is normal. No respiratory distress.  Musculoskeletal:        General: No deformity. Normal range of motion.     Cervical back: Normal range of motion.  Skin:    General: Skin is warm and dry.  Neurological:     General: No focal deficit present.     Mental Status: He is alert and oriented to person, place, and time. Mental status is at baseline.  Psychiatric:        Mood and Affect: Mood normal.    ED Results / Procedures / Treatments   Labs (all labs ordered are listed, but only abnormal results are displayed) Labs Reviewed - No data to display  EKG None  Radiology No results found.  Procedures Procedures   Medications Ordered in ED Medications - No data to display  ED Course  I have reviewed the triage vital signs and the nursing notes.  Pertinent labs & imaging results that were available during my care of the patient were reviewed by me and considered in my medical decision making (see chart for details).    MDM Rules/Calculators/A&P                           Benjamim Fortenberry presents with what is likely a viral illness.  He does have diabetes which increases his risk for more severe illness.  I told him he likely does have a virus, but he states that he has had difficulty with severe infections in the past.  I asked him to avoid taking his oral antibiotic for another 2 days to see if he would get better with his symptomatic management.  He can go ahead and use the eyedrops.  He agreed to do this plan, and return precautions were given. Final Clinical Impression(s) / ED Diagnoses Final diagnoses:  Acute non-recurrent frontal sinusitis    Rx / DC Orders ED Discharge Orders          Ordered    ciprofloxacin  (CILOXAN) 0.3 % ophthalmic solution  Every 4 hours while awake        06/09/21 2122    amoxicillin (AMOXIL) 500 MG capsule  2 times daily        06/09/21 2122             Arnaldo Natal, MD 06/09/21 2124

## 2021-06-09 NOTE — ED Triage Notes (Signed)
Patient reports intense pressure behind his eyes, in his head and pain in his ear. Patient reports he has had a sinus infection in the past but that this seems quicker acting than previous.

## 2021-07-18 ENCOUNTER — Other Ambulatory Visit: Payer: Self-pay

## 2021-08-02 ENCOUNTER — Other Ambulatory Visit: Payer: Self-pay | Admitting: Family Medicine

## 2021-08-02 DIAGNOSIS — Z794 Long term (current) use of insulin: Secondary | ICD-10-CM

## 2021-08-02 DIAGNOSIS — E11649 Type 2 diabetes mellitus with hypoglycemia without coma: Secondary | ICD-10-CM

## 2021-08-03 ENCOUNTER — Other Ambulatory Visit: Payer: Self-pay

## 2021-08-03 MED ORDER — INSULIN GLARGINE 100 UNIT/ML ~~LOC~~ SOLN
12.0000 [IU] | Freq: Every day | SUBCUTANEOUS | 6 refills | Status: AC
Start: 2021-08-03 — End: 2021-11-24
  Filled 2021-08-03: qty 40, 112d supply, fill #0

## 2021-08-04 ENCOUNTER — Other Ambulatory Visit: Payer: Self-pay

## 2021-11-15 ENCOUNTER — Other Ambulatory Visit: Payer: Self-pay

## 2021-11-20 ENCOUNTER — Other Ambulatory Visit: Payer: Self-pay

## 2021-11-20 ENCOUNTER — Encounter (HOSPITAL_COMMUNITY): Payer: Self-pay

## 2021-11-20 ENCOUNTER — Emergency Department (HOSPITAL_COMMUNITY)
Admission: EM | Admit: 2021-11-20 | Discharge: 2021-11-21 | Disposition: A | Discharge status: Home / Self Care | Payer: Medicaid Other | Attending: Emergency Medicine | Admitting: Emergency Medicine

## 2021-11-20 DIAGNOSIS — Z79899 Other long term (current) drug therapy: Secondary | ICD-10-CM | POA: Insufficient documentation

## 2021-11-20 DIAGNOSIS — T39392A Poisoning by other nonsteroidal anti-inflammatory drugs [NSAID], intentional self-harm, initial encounter: Secondary | ICD-10-CM | POA: Insufficient documentation

## 2021-11-20 DIAGNOSIS — F322 Major depressive disorder, single episode, severe without psychotic features: Secondary | ICD-10-CM | POA: Insufficient documentation

## 2021-11-20 DIAGNOSIS — Z794 Long term (current) use of insulin: Secondary | ICD-10-CM | POA: Insufficient documentation

## 2021-11-20 DIAGNOSIS — R45851 Suicidal ideations: Secondary | ICD-10-CM | POA: Insufficient documentation

## 2021-11-20 DIAGNOSIS — Z20822 Contact with and (suspected) exposure to covid-19: Secondary | ICD-10-CM | POA: Insufficient documentation

## 2021-11-20 LAB — CBC
HCT: 45 % (ref 39.0–52.0)
Hemoglobin: 15.6 g/dL (ref 13.0–17.0)
MCH: 30.5 pg (ref 26.0–34.0)
MCHC: 34.7 g/dL (ref 30.0–36.0)
MCV: 88.1 fL (ref 80.0–100.0)
Platelets: 296 10*3/uL (ref 150–400)
RBC: 5.11 MIL/uL (ref 4.22–5.81)
RDW: 12.5 % (ref 11.5–15.5)
WBC: 6.6 10*3/uL (ref 4.0–10.5)
nRBC: 0 % (ref 0.0–0.2)

## 2021-11-20 LAB — COMPREHENSIVE METABOLIC PANEL
ALT: 16 U/L (ref 0–44)
AST: 17 U/L (ref 15–41)
Albumin: 4.3 g/dL (ref 3.5–5.0)
Alkaline Phosphatase: 35 U/L — ABNORMAL LOW (ref 38–126)
Anion gap: 5 (ref 5–15)
BUN: 11 mg/dL (ref 6–20)
CO2: 29 mmol/L (ref 22–32)
Calcium: 8.7 mg/dL — ABNORMAL LOW (ref 8.9–10.3)
Chloride: 103 mmol/L (ref 98–111)
Creatinine, Ser: 1.2 mg/dL (ref 0.61–1.24)
GFR, Estimated: >60 mL/min (ref >60)
Glucose, Bld: 216 mg/dL — ABNORMAL HIGH (ref 70–99)
Potassium: 3.8 mmol/L (ref 3.5–5.1)
Sodium: 137 mmol/L (ref 135–145)
Total Bilirubin: 1.6 mg/dL — ABNORMAL HIGH (ref 0.3–1.2)
Total Protein: 7.6 g/dL (ref 6.5–8.1)

## 2021-11-20 LAB — RAPID URINE DRUG SCREEN, HOSP PERFORMED
Amphetamines: NOT DETECTED
Barbiturates: NOT DETECTED
Benzodiazepines: NOT DETECTED
Cocaine: NOT DETECTED
Opiates: NOT DETECTED
Tetrahydrocannabinol: NOT DETECTED

## 2021-11-20 LAB — CBG MONITORING, ED: Glucose-Capillary: 216 mg/dL — ABNORMAL HIGH (ref 70–99)

## 2021-11-20 LAB — SALICYLATE LEVEL: Salicylate Lvl: <7 mg/dL — ABNORMAL LOW (ref 7.0–30.0)

## 2021-11-20 LAB — ACETAMINOPHEN LEVEL: Acetaminophen (Tylenol), Serum: <10 ug/mL — ABNORMAL LOW (ref 10–30)

## 2021-11-20 LAB — ETHANOL: Alcohol, Ethyl (B): <10 mg/dL (ref <10)

## 2021-11-20 NOTE — ED Provider Notes (Signed)
[facility] DEPT Provider Note   CSN: [id number] Arrival date & time: 11/20/21  2217     History  Chief Complaint  Patient presents with   Drug Overdose   Suicide Attempt    Jquan Bascomb is a 31 y.o. male.  The history is provided by the patient and medical records.  Drug Overdose  31 year old male with history of seasonal allergies presenting to the ED after intentional overdose.  He states he has been overwhelmed with "life" recently and has been struggling to keep his mind right.  He admits he has been thinking of suicide but acted impulsively tonight by taking handful of ibuprofen.  Estimated around 3600 mg.  He took this around 8 PM.  He denies any current GI upset.  No other coingestions.  He denies any HI or AVH.  Home Medications Prior to Admission medications   Medication Sig Start Date End Date Taking? Authorizing Provider  atorvastatin (LIPITOR) 20 MG tablet Take 1 tablet (20 mg total) by mouth daily. 01/25/21   Charlott Rakes, MD  blood glucose meter kit and supplies KIT Dispense based on patient and insurance preference. Use up to four times daily as directed. (FOR ICD-9 250.00, 250.01). 05/01/20   McDonald, Mia A, PA-C  insulin glargine (LANTUS) 100 UNIT/ML injection INJECT 0.1 MLS (10 UNITS TOTAL) INTO THE SKIN DAILY. 10/20/20 10/20/21  Charlott Rakes, MD  insulin glargine (LANTUS) 100 UNIT/ML injection Inject 0.12 mLs (12 Units total) into the skin daily. 08/03/21 11/24/21  Charlott Rakes, MD  Insulin Syringe-Needle U-100 (TRUEPLUS INSULIN SYRINGE) 31G X 5/16" 0.3 ML MISC USE AS INSTRUCTED TO INJECT LANTUS DAILY. 05/13/21 05/13/22  Charlott Rakes, MD  Needles & Syringes MISC For use with Lantus 07/20/20   Charlott Rakes, MD      Allergies    Sudafed [pseudoephedrine]    Review of Systems   Review of Systems  Psychiatric/Behavioral:  Positive for suicidal ideas.   All other systems reviewed and are negative.  Physical Exam Updated  Vital Signs BP 127/86    Pulse (!) 57    Temp 97.8 F (36.6 C) (Oral)    Resp 15    Ht '5\' 9"'  (1.753 m)    Wt 93 kg    SpO2 98%    BMI 30.28 kg/m   Physical Exam Vitals and nursing note reviewed.  Constitutional:      Appearance: He is well-developed.  HENT:     Head: Normocephalic and atraumatic.  Eyes:     Conjunctiva/sclera: Conjunctivae normal.     Pupils: Pupils are equal, round, and reactive to light.  Cardiovascular:     Rate and Rhythm: Normal rate and regular rhythm.     Heart sounds: Normal heart sounds.  Pulmonary:     Effort: Pulmonary effort is normal. No respiratory distress.     Breath sounds: Normal breath sounds. No rhonchi.  Abdominal:     General: Bowel sounds are normal.     Palpations: Abdomen is soft.  Musculoskeletal:        General: Normal range of motion.     Cervical back: Normal range of motion.  Skin:    General: Skin is warm and dry.  Neurological:     Mental Status: He is alert and oriented to person, place, and time.  Psychiatric:        Attention and Perception: He does not perceive auditory or visual hallucinations.        Thought Content: Thought content includes  suicidal ideation. Thought content does not include homicidal ideation. Thought content includes suicidal plan. Thought content does not include homicidal plan.    ED Results / Procedures / Treatments   Labs (all labs ordered are listed, but only abnormal results are displayed) Labs Reviewed  COMPREHENSIVE METABOLIC PANEL - Abnormal; Notable for the following components:      Result Value   Glucose, Bld 216 (*)    Calcium 8.7 (*)    Alkaline Phosphatase 35 (*)    Total Bilirubin 1.6 (*)    All other components within normal limits  ACETAMINOPHEN LEVEL - Abnormal; Notable for the following components:   Acetaminophen (Tylenol), Serum <10 (*)    All other components within normal limits  SALICYLATE LEVEL - Abnormal; Notable for the following components:   Salicylate Lvl <4.4  (*)    All other components within normal limits  ACETAMINOPHEN LEVEL - Abnormal; Notable for the following components:   Acetaminophen (Tylenol), Serum <10 (*)    All other components within normal limits  SALICYLATE LEVEL - Abnormal; Notable for the following components:   Salicylate Lvl <9.7 (*)    All other components within normal limits  CBG MONITORING, ED - Abnormal; Notable for the following components:   Glucose-Capillary 216 (*)    All other components within normal limits  RESP PANEL BY RT-PCR (FLU A&B, COVID) ARPGX2  ETHANOL  CBC  RAPID URINE DRUG SCREEN, HOSP PERFORMED    EKG EKG Interpretation  Date/Time:  Sunday November 20 2021 22:33:00 EST Ventricular Rate:  64 PR Interval:  131 QRS Duration: 100 QT Interval:  392 QTC Calculation: 405 R Axis:   68 Text Interpretation: Sinus rhythm Borderline T abnormalities, diffuse leads Confirmed by Dene Gentry [id number]) on 11/20/2021 10:44:37 PM  Radiology No results found.  Procedures Procedures    Medications Ordered in ED Medications - No data to display  ED Course/ Medical Decision Making/ A&P                           Medical Decision Making Amount and/or Complexity of Data Reviewed Labs: ordered. ECG/medicine tests: ordered and independent interpretation performed.   31 year old male presenting to the ED with SI with plan to overdose.  Took approximately 3600 mg Motrin around 8 PM.  Admits this was intentional self-harm.  States he has been depressed recently due to a lot of life stressors.  He denies any current abdominal pain, nausea, or vomiting.  He denies any other coingestions with the Motrin.  Poison control has been contacted, recommended EKG, labs, monitoring with 4-hour Tylenol and salicylate levels.  EKG without any acute ischemia or QT prolongation.  Initial labs are reassuring with normal Tylenol and salicylate levels.  Patient has been monitored without any issues, still remains without any  abdominal pain or vomiting.  Repeat Tylenol and salicylate levels remain negative.  He can be medically cleared at this point.  We will get TTS consultation.  TTS has evaluated-- recommends observation in the ED, psychiatry will reassess this AM for disposition.  Final Clinical Impression(s) / ED Diagnoses Final diagnoses:  Intentional drug overdose, initial encounter [facility])  Suicidal ideation    Rx / DC Orders ED Discharge Orders     None         Larene Pickett, PA-C 11/21/21 0604    Valarie Merino, MD 11/21/21 2330

## 2021-11-20 NOTE — ED Triage Notes (Signed)
Patient BIB [facility] from home. Overwhelmed with a lot of things tonight. Took 3600mg  Ibuprofen as a suicide attempt. Never done anything like this before. No symptoms as of now, just feeling anxious from the situation. Poison controlled called, recommended cardiac monitoring, hydration with saline (patient received in route). Patient is diabetic.   EMS 18G Left AC

## 2021-11-20 NOTE — ED Notes (Signed)
Caryn Bee from [facility] Control recommends: EKG, ETOH, CMP, CBC, 4hr tylenol and salicylate. Cleared if 4 hour tylenol is negative. Monitor for metabolic acidosis and GI upset in the mean time.

## 2021-11-21 LAB — CBG MONITORING, ED: Glucose-Capillary: 107 mg/dL — ABNORMAL HIGH (ref 70–99)

## 2021-11-21 LAB — SALICYLATE LEVEL: Salicylate Lvl: <7 mg/dL — ABNORMAL LOW (ref 7.0–30.0)

## 2021-11-21 LAB — RESP PANEL BY RT-PCR (FLU A&B, COVID) ARPGX2
Influenza A by PCR: NEGATIVE
Influenza B by PCR: NEGATIVE
SARS Coronavirus 2 by RT PCR: NEGATIVE

## 2021-11-21 LAB — ACETAMINOPHEN LEVEL: Acetaminophen (Tylenol), Serum: <10 ug/mL — ABNORMAL LOW (ref 10–30)

## 2021-11-21 MED ORDER — INSULIN GLARGINE-YFGN 100 UNIT/ML ~~LOC~~ SOLN
12.0000 [IU] | Freq: Every day | SUBCUTANEOUS | Status: DC
  Filled 2021-11-21: qty 0.12

## 2021-11-21 MED ORDER — INSULIN GLARGINE-YFGN 100 UNIT/ML ~~LOC~~ SOLN
12.0000 [IU] | SUBCUTANEOUS | Status: DC
  Filled 2021-11-21: qty 0.12

## 2021-11-21 NOTE — BH Assessment (Signed)
Comprehensive Clinical Assessment (CCA) Note  11/21/2021 Juan Friedman [id number]  DISPOSITION: Gave clinical report to Juan Musa, PA who recommends Pt be observed and evaluated by psychiatry later today. Notified Juan Carnes, PA-C and Juan Medin, RN of recommendation via secure message.  The patient demonstrates the following risk factors for suicide: Chronic risk factors for suicide include: N/A. Acute risk factors for suicide include: loss (financial, interpersonal, professional). Protective factors for this patient include: positive social support and responsibility to others (children, family). Considering these factors, the overall suicide risk at this point appears to be high. Patient is not appropriate for outpatient follow up.  [place] ED from 11/20/2021 in [facility] ED from 06/09/2021 in [facility]  C-SSRS RISK CATEGORY High Risk No Risk      Pt is a 31 year old married male who presents unaccompanied to [place] ED after ingesting approximately 3600 mg of ibuprofen in a suicide attempt. Pt reports he has experienced brief episodes of suicidal ideation in the past denies ever acting on suicidal thoughts. He states recently he has found it difficult to cope with life stressors. He cannot identify a specific trigger but says he was sitting in his car in a parking lot and decided to kill himself by overdose. He says he had a vision of his deceased grandmother comforting him and reassuring him and he decided to return home, adding his wife had already called law enforcement. Pt describes his mood recently as "alone" and acknowledges symptoms including crying spells, social isolation, fatigue, and feeling of hopelessness and worthlessness. He describes worrying frequently. Pt denies any history of intentional self-injurious behaviors. Pt denies current homicidal ideation or history of violence. Pt denies any history of  auditory or visual hallucinations. Pt denies history of alcohol or other substance use.  Pt identifies several stressors and says, "Life has been beating me up." He says he does not like where he is in his life. He reports he was recently diagnosed with diabetes and his lifestyle and body have changed. He says his wife has obtained a demanding job making more money and he feels relying financially on his wife affects his self-worth as a man. 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 He describes financial stress. They have a 77-year-old son together and she is currently pregnant. Pt denies history of abuse but reports he had several friends die growing up, including witnessing a peer in 8th grade die from a heart attack. He denies legal problems. He denies access to firearms. He denies any history of inpatient or outpatient mental health treatment.  Pt is dressed in hospital scrubs, alert and oriented x4. Pt speaks in a clear tone, at moderate volume and normal pace. Motor behavior appears normal. Eye contact is good. Pt's mood is depressed and affect is congruent with mood. Thought process is coherent and relevant. There is no indication Pt is currently responding to internal stimuli or experiencing delusional thought content. Pt was calm and cooperative throughout assessment. He does not want to be psychiatrically hospitalized, explaining that being out of the home would be a hardship for his pregnant wife and their son.   Chief Complaint:  Chief Complaint  Patient presents with   Drug Overdose   Suicide Attempt   Visit Diagnosis: F32.2 Major depressive disorder, Single episode, Severe   CCA Screening, Triage and Referral (STR)  Patient Reported Information How did you hear about [place]? Family/Friend  What Is the Reason for Your Visit/Call Today? Pt reports he feels overwhelmed by stressors and while sitting in a parking lot ingested approximately 3600 mg of  ibuprophen in a suicide attempt. He denies current suicidal ideation. He denies any history of mental health treatment.  How Long Has This Been Causing You Problems? 1-6 months  What Do You Feel Would Help You the Most Today? Treatment for Depression or other mood problem   Have You Recently Had Any Thoughts About Hurting Yourself? Yes  Are You Planning to Commit Suicide/Harm Yourself At This time? No   Have you Recently Had Thoughts About [place]? No  Are You Planning to Harm Someone at This Time? No  Explanation: No data recorded  Have You Used Any Alcohol or Drugs in the Past 24 Hours? No  How Long Ago Did You Use Drugs or Alcohol? No data recorded What Did You Use and How Much? No data recorded  Do You Currently Have a Therapist/Psychiatrist? No  Name of Therapist/Psychiatrist: No data recorded  Have You Been Recently Discharged From Any Office Practice or Programs? No  Explanation of Discharge From Practice/Program: No data recorded    CCA Screening Triage Referral Assessment Type of Contact: Tele-Assessment  Telemedicine Service Delivery: Telemedicine service delivery: This service was provided via telemedicine using a 2-way, interactive audio and video technology  Is this Initial or Reassessment? Initial Assessment  Date Telepsych consult ordered in [facility]:  11/21/21  Time Telepsych consult ordered in [facility]:  0239  Location of Assessment: [facility]  Provider Location: [facility] Assessment Services   Collateral Involvement: None   Does Patient Have a Stage manager Guardian? No data recorded Name and Contact of Legal Guardian: No data recorded If Minor and Not Living with Parent(s), Who has Custody? NA  Is CPS involved or ever been involved? Never  Is APS involved or ever been involved? Never   Patient Determined To Be At Risk for Harm To Self or Others Based on Review of Patient Reported Information or Presenting Complaint? Yes, for  Self-Harm  Method: No data recorded Availability of Means: No data recorded Intent: No data recorded Notification Required: No data recorded Additional Information for Danger to Others Potential: No data recorded Additional Comments for Danger to Others Potential: No data recorded Are There Guns or Other Weapons in Your Home? No data recorded Types of Guns/Weapons: No data recorded Are These Weapons Safely Secured?                            No data recorded Who Could Verify You Are Able To Have These Secured: No data recorded Do You Have any Outstanding Charges, Pending Court Dates, Parole/Probation? No data recorded Contacted To Inform of Risk of Harm To Self or Others: No data recorded   Does Patient Present under Involuntary Commitment? No  IVC Papers Initial File Date: No data recorded  [place] of Residence: [place]   Patient Currently Receiving the Following Services: Not Receiving Services   Determination of Need: Emergent (2 hours)   Options For Referral: Facility-Based Crisis; Inpatient Hospitalization; Medication Management; Outpatient Therapy; [facility]     CCA Biopsychosocial Patient Reported Schizophrenia/Schizoaffective Diagnosis in Past: No   Strengths: Pt is articulate and has good family support.   Mental Health Symptoms Depression:   Tearfulness; Worthlessness; Hopelessness; Fatigue; Change in energy/activity   Duration of Depressive symptoms:  Duration of Depressive Symptoms: Greater than  two weeks   Mania:   None   Anxiety:    Worrying; Fatigue   Psychosis:   None   Duration of Psychotic symptoms:    Trauma:   None   Obsessions:   None   Compulsions:   None   Inattention:   N/A   Hyperactivity/Impulsivity:   N/A   Oppositional/Defiant Behaviors:   N/A   Emotional Irregularity:   None   Other Mood/Personality Symptoms:   NA    Mental Status Exam Appearance and self-care  Stature:   Average   Weight:    Average weight   Clothing:   -- (Scrubs)   Grooming:   Normal   Cosmetic use:   None   Posture/gait:   Normal   Motor activity:   Not Remarkable   Sensorium  Attention:   Normal   Concentration:   Normal   Orientation:   X5   Recall/memory:   Normal   Affect and Mood  Affect:   Appropriate   Mood:   Depressed; Euthymic   Relating  Eye contact:   Normal   Facial expression:   Responsive   Attitude toward examiner:   Cooperative   Thought and Language  Speech flow:  Normal; Clear and Coherent   Thought content:   Appropriate to Mood and Circumstances   Preoccupation:   None   Hallucinations:   None   Organization:  No data recorded  [employer] of Knowledge:   Average   Intelligence:   Average   Abstraction:   Normal   Judgement:   Fair   Art therapist:   Realistic   Insight:   Gaps   Decision Making:   Normal   Social Functioning  Social Maturity:   Responsible   Social Judgement:   Normal   Stress  Stressors:   Museum/gallery curator; Transitions; Work   Coping Ability:   Programme researcher, broadcasting/film/video Deficits:   None   Supports:   Family     Religion: Religion/Spirituality Are You A Religious Person?: Yes What is Your Religious Affiliation?: Non-Denominational How Might This Affect Treatment?: NA  Leisure/Recreation: Leisure / Recreation Do You Have Hobbies?: Yes Leisure and Hobbies: Music  Exercise/Diet: Exercise/Diet Do You Exercise?: Yes What Type of Exercise Do You Do?: Run/Walk How Many Times a Week Do You Exercise?: 1-3 times a week Have You Gained or Lost A Significant Amount of Weight in the Past Six Months?: No Do You Follow a Special Diet?: Yes Type of Diet: Diabetic Do You Have Any Trouble Sleeping?: Yes Explanation of Sleeping Difficulties: Frequent waking   CCA Employment/Education Employment/Work Situation: Employment / Work Situation Employment Situation: Employed Work  Stressors: Pt does not make as much money as he would like Patient's Job has Been Impacted by Current Illness: No Has Patient ever Been in the [employer]?: No  Education: Education Is Patient Currently Attending School?: No Last Grade Completed: 28 Did You Nutritional therapist?: Yes What Type of College Degree Do you Have?: Bachelor's degree Did You Have An Individualized Education Program (IIEP): No Did You Have Any Difficulty At School?: No Patient's Education Has Been Impacted by Current Illness: No   CCA Family/Childhood History Family and Relationship History: Family history Marital status: Married What types of issues is patient dealing with in the relationship?: Wife is making more money than Pt, which affects his feelings of self-worth Additional relationship information: Wife is pregnant Does patient have children?: Yes How many children?: 1 How is  patient's relationship with their children?: 41-year-old son  Childhood History:  Childhood History By whom was/is the patient raised?: Both parents Did patient suffer any verbal/emotional/physical/sexual abuse as a child?: No Did patient suffer from severe childhood neglect?: No Has patient ever been sexually abused/assaulted/raped as an adolescent or adult?: No Was the patient ever a victim of a crime or a disaster?: No Witnessed domestic violence?: No Has patient been affected by domestic violence as an adult?: No  Child/Adolescent Assessment:     CCA Substance Use Alcohol/Drug Use: Alcohol / Drug Use Pain Medications: Denies use Prescriptions: Denies abuse Over the Counter: Denies abuse History of alcohol / drug use?: No history of alcohol / drug abuse Longest period of sobriety (when/how long): NA                         ASAM's:  Six Dimensions of Multidimensional Assessment  Dimension 1:  Acute Intoxication and/or Withdrawal Potential:      Dimension 2:  Biomedical Conditions and Complications:       Dimension 3:  Emotional, Behavioral, or Cognitive Conditions and Complications:     Dimension 4:  Readiness to Change:     Dimension 5:  Relapse, Continued use, or Continued Problem Potential:     Dimension 6:  Recovery/Living Environment:     ASAM Severity Score:    ASAM Recommended Level of Treatment:     Substance use Disorder (SUD)    Recommendations for Services/Supports/Treatments:    Discharge Disposition: Discharge Disposition Medical Exam completed: Yes  DSM5 Diagnoses: Patient Active Problem List   Diagnosis Date Noted   Seasonal allergies      Referrals to Alternative Service(s): Referred to Alternative Service(s):   Place:   Date:   Time:    Referred to Alternative Service(s):   Place:   Date:   Time:    Referred to Alternative Service(s):   Place:   Date:   Time:    Referred to Alternative Service(s):   Place:   Date:   Time:     Evelena Peat, [facility]

## 2021-11-21 NOTE — Discharge Instructions (Addendum)
I strongly recommend that you call to schedule follow-up appointment with psychiatry for your suicidal thoughts.  If you ever feel like hurting yourself again, or hurting someone else, please call 911 or return to the emergency department.

## 2021-11-21 NOTE — ED Notes (Signed)
TTS at bedside. 

## 2021-11-21 NOTE — ED Notes (Signed)
Karel Jarvis, PA recommends Pt be observed and evaluated by psychiatry. Cecilio Asper, NP at [facility] is recommending Pt be observed in ED and psychiatry will see him this morning.

## 2021-11-21 NOTE — ED Provider Notes (Addendum)
Reordered morning insulin 12 units once daily as confirmed by nursing staff.   Wyvonnia Dusky, MD 11/21/21 [phone]  1130 AM-patient no longer wanting to stay for psychiatric evaluation.  He is not under IVC per review of earlier provider chart.  He had been here voluntarily for psychiatric evaluation, and will provide resources for him.  He does not endorse continued active SI at this time.  He had been medically cleared from an overdose perspective.  I recommend that he follow-up with the behavioral health hospital and return to the ER if he has any further suicidal thoughts.    Wyvonnia Dusky, MD 11/21/21 [phone]

## 2021-11-21 NOTE — ED Notes (Signed)
Patients keys given to wife. Patient gave permission for this to happen.

## 2021-11-21 NOTE — ED Notes (Signed)
Patient calm, cooperative. Offered food and drink, patient declined.

## 2021-11-21 NOTE — ED Notes (Signed)
Patient dressed out into burgundy scrubs. Patient has 2 belonging bags located at the nurse station across from room 18.

## 2021-11-21 NOTE — ED Notes (Addendum)
Patient states that we are holding him against his will and he is ready to go. Langston Masker, MD made aware. [facility] has medically cleared patient and set him up for discharge. Resources were provided for patient.

## 2021-12-20 ENCOUNTER — Other Ambulatory Visit: Payer: Self-pay

## 2023-01-24 ENCOUNTER — Emergency Department (HOSPITAL_BASED_OUTPATIENT_CLINIC_OR_DEPARTMENT_OTHER)
Admission: EM | Admit: 2023-01-24 | Discharge: 2023-01-24 | Disposition: A | Discharge status: Home / Self Care | Payer: Medicaid Other | Attending: Emergency Medicine | Admitting: Emergency Medicine

## 2023-01-24 ENCOUNTER — Other Ambulatory Visit: Payer: Self-pay

## 2023-01-24 ENCOUNTER — Emergency Department (HOSPITAL_BASED_OUTPATIENT_CLINIC_OR_DEPARTMENT_OTHER): Payer: Medicaid Other

## 2023-01-24 ENCOUNTER — Telehealth (HOSPITAL_BASED_OUTPATIENT_CLINIC_OR_DEPARTMENT_OTHER): Payer: Self-pay

## 2023-01-24 DIAGNOSIS — E119 Type 2 diabetes mellitus without complications: Secondary | ICD-10-CM | POA: Insufficient documentation

## 2023-01-24 DIAGNOSIS — S86012A Strain of left Achilles tendon, initial encounter: Secondary | ICD-10-CM

## 2023-01-24 DIAGNOSIS — Y9367 Activity, basketball: Secondary | ICD-10-CM | POA: Insufficient documentation

## 2023-01-24 DIAGNOSIS — Z794 Long term (current) use of insulin: Secondary | ICD-10-CM | POA: Insufficient documentation

## 2023-01-24 DIAGNOSIS — S86002A Unspecified injury of left Achilles tendon, initial encounter: Secondary | ICD-10-CM | POA: Diagnosis not present

## 2023-01-24 DIAGNOSIS — M79672 Pain in left foot: Secondary | ICD-10-CM | POA: Diagnosis present

## 2023-01-24 DIAGNOSIS — X509XXA Other and unspecified overexertion or strenuous movements or postures, initial encounter: Secondary | ICD-10-CM | POA: Diagnosis not present

## 2023-01-24 MED ORDER — OXYCODONE-ACETAMINOPHEN 5-325 MG PO TABS: 1.0000 | ORAL_TABLET | Freq: Four times a day (QID) | ORAL | 0 refills | Status: AC | PRN

## 2023-01-24 MED ORDER — OXYCODONE-ACETAMINOPHEN 5-325 MG PO TABS: 1.0000 | ORAL_TABLET | Freq: Four times a day (QID) | ORAL | 0 refills | Status: DC | PRN

## 2023-01-24 NOTE — ED Triage Notes (Signed)
Pt arrives via POV d/t concerns for left side foot pain after playing basketball 30 min PTA. No distress in triage.

## 2023-01-24 NOTE — ED Provider Notes (Signed)
[place] Provider Note   CSN: [id number] Arrival date & time: 01/24/23  1853     History  Chief Complaint  Patient presents with   Foot Pain    L    Juan Friedman is a 32 y.o. male.   Foot Pain  Patient presents with left heel/foot pain.  Felt a pop in his Achilles area playing basketball today.  States he cannot walk on it due to the pain.  States he felt as if something tore.  No other injury.    Past Medical History:  Diagnosis Date   Diabetes mellitus without complication ([place])    Seasonal allergies     Home Medications Prior to Admission medications   Medication Sig Start Date End Date Taking? Authorizing Provider  atorvastatin (LIPITOR) 20 MG tablet Take 1 tablet (20 mg total) by mouth daily. 01/25/21   Charlott Rakes, MD  blood glucose meter kit and supplies KIT Dispense based on patient and insurance preference. Use up to four times daily as directed. (FOR ICD-9 250.00, 250.01). 05/01/20   McDonald, Mia A, PA-C  insulin glargine (LANTUS) 100 UNIT/ML injection Inject 0.12 mLs (12 Units total) into the skin daily. 08/03/21 11/24/21  Charlott Rakes, MD  Needles & Syringes MISC For use with Lantus 07/20/20   Charlott Rakes, MD  oxyCODONE-acetaminophen (PERCOCET/ROXICET) 5-325 MG tablet Take 1 tablet by mouth every 6 (six) hours as needed for severe pain. 01/24/23   Davonna Belling, MD      Allergies    Sudafed [pseudoephedrine]    Review of Systems   Review of Systems  Physical Exam Updated Vital Signs BP (!) 141/94 (BP Location: Right Arm)   Pulse 76   Temp 97.8 F (36.6 C) (Oral)   Resp 17   SpO2 100%  Physical Exam Vitals and nursing note reviewed.  Musculoskeletal:     Comments: Tenderness over distal Achilles tendon.  Appears to be somewhat hollow in the area also.  Some tenderness over calf also.  Tenderness over heel.  Skin:    General: Skin is warm.  Neurological:     Mental Status: He is alert.      ED Results / Procedures / Treatments   Labs (all labs ordered are listed, but only abnormal results are displayed) Labs Reviewed - No data to display  EKG None  Radiology DG Ankle Left Port  Result Date: 01/24/2023 CLINICAL DATA:  Trauma, pain EXAM: PORTABLE LEFT ANKLE - 2 VIEW COMPARISON:  None Available. FINDINGS: No recent fracture or dislocation is seen. There is a 1.5 cm smoothly marginated calcification in the distal course of Achilles tendon. IMPRESSION: No recent displaced fracture or dislocation is seen in left ankle. 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 Electronically Signed   By: Elmer Picker M.D.   On: 01/24/2023 19:47    Procedures Procedures    Medications Ordered in ED Medications - No data to display  ED Course/ Medical Decision Making/ A&P                             Medical Decision Making Amount and/or Complexity of Data Reviewed Radiology: ordered.  Risk Prescription drug management.   Patient with acute onset of pain in the left Achilles.  Began while playing basketball.  Differential diagnosis includes strain but I think most likely he has an Achilles rupture.  There is on an x-ray piece of bone that was read as calcific tendinosis but I think potentially is a avulsed piece off the calcaneus.  Will have patient follow-up with orthopedic surgery.  Given cam walker but instructed to not weight-bear.  Also given crutches.  Pain medicines given although there was some issues with the pharmacy since it had to be changed after a different pharmacy choice was placed.        Final Clinical Impression(s) / ED Diagnoses Final diagnoses:  Achilles rupture, left, initial encounter    Rx / DC Orders ED Discharge Orders          Ordered    oxyCODONE-acetaminophen (PERCOCET/ROXICET) 5-325 MG tablet  Every 6 hours PRN,   Status:  Discontinued        01/24/23  2151    oxyCODONE-acetaminophen (PERCOCET/ROXICET) 5-325 MG tablet  Every 6 hours PRN        01/24/23 2158              Davonna Belling, MD 01/24/23 2345

## 2023-01-24 NOTE — ED Notes (Signed)
RN reviewed discharge instructions with pt. Pt verbalized understanding and had no further questions. VSS upon discharge.  

## 2023-09-17 ENCOUNTER — Emergency Department (HOSPITAL_BASED_OUTPATIENT_CLINIC_OR_DEPARTMENT_OTHER)
Admission: EM | Admit: 2023-09-17 | Discharge: 2023-09-18 | Disposition: A | Discharge status: Home / Self Care | Payer: Medicaid Other | Attending: Emergency Medicine | Admitting: Emergency Medicine

## 2023-09-17 ENCOUNTER — Other Ambulatory Visit: Payer: Self-pay

## 2023-09-17 ENCOUNTER — Encounter (HOSPITAL_BASED_OUTPATIENT_CLINIC_OR_DEPARTMENT_OTHER): Payer: Self-pay

## 2023-09-17 DIAGNOSIS — R739 Hyperglycemia, unspecified: Secondary | ICD-10-CM

## 2023-09-17 DIAGNOSIS — R202 Paresthesia of skin: Secondary | ICD-10-CM | POA: Insufficient documentation

## 2023-09-17 DIAGNOSIS — Z794 Long term (current) use of insulin: Secondary | ICD-10-CM | POA: Diagnosis not present

## 2023-09-17 DIAGNOSIS — R200 Anesthesia of skin: Secondary | ICD-10-CM | POA: Diagnosis present

## 2023-09-17 DIAGNOSIS — E1165 Type 2 diabetes mellitus with hyperglycemia: Secondary | ICD-10-CM | POA: Diagnosis not present

## 2023-09-17 LAB — CBC WITH DIFFERENTIAL/PLATELET
Abs Immature Granulocytes: 0.03 10*3/uL (ref 0.00–0.07)
Basophils Absolute: 0.1 10*3/uL (ref 0.0–0.1)
Basophils Relative: 1 %
Eosinophils Absolute: 0.3 10*3/uL (ref 0.0–0.5)
Eosinophils Relative: 4 %
HCT: 40.1 % (ref 39.0–52.0)
Hemoglobin: 14.5 g/dL (ref 13.0–17.0)
Immature Granulocytes: 0 %
Lymphocytes Relative: 33 %
Lymphs Abs: 2.9 10*3/uL (ref 0.7–4.0)
MCH: 31.3 pg (ref 26.0–34.0)
MCHC: 36.2 g/dL — ABNORMAL HIGH (ref 30.0–36.0)
MCV: 86.4 fL (ref 80.0–100.0)
Monocytes Absolute: 0.6 10*3/uL (ref 0.1–1.0)
Monocytes Relative: 7 %
Neutro Abs: 5 10*3/uL (ref 1.7–7.7)
Neutrophils Relative %: 55 %
Platelets: 260 10*3/uL (ref 150–400)
RBC: 4.64 MIL/uL (ref 4.22–5.81)
RDW: 12.3 % (ref 11.5–15.5)
WBC: 9 10*3/uL (ref 4.0–10.5)
nRBC: 0 % (ref 0.0–0.2)

## 2023-09-17 LAB — COMPREHENSIVE METABOLIC PANEL
ALT: 17 U/L (ref 0–44)
AST: 12 U/L — ABNORMAL LOW (ref 15–41)
Albumin: 4.1 g/dL (ref 3.5–5.0)
Alkaline Phosphatase: 51 U/L (ref 38–126)
Anion gap: 5 (ref 5–15)
BUN: 9 mg/dL (ref 6–20)
CO2: 29 mmol/L (ref 22–32)
Calcium: 8.8 mg/dL — ABNORMAL LOW (ref 8.9–10.3)
Chloride: 92 mmol/L — ABNORMAL LOW (ref 98–111)
Creatinine, Ser: 1.23 mg/dL (ref 0.61–1.24)
GFR, Estimated: >60 mL/min (ref >60)
Glucose, Bld: 688 mg/dL (ref 70–99)
Potassium: 4.9 mmol/L (ref 3.5–5.1)
Sodium: 126 mmol/L — ABNORMAL LOW (ref 135–145)
Total Bilirubin: 1.2 mg/dL — ABNORMAL HIGH (ref <1.2)
Total Protein: 6.8 g/dL (ref 6.5–8.1)

## 2023-09-17 MED ORDER — SODIUM CHLORIDE 0.9 % IV BOLUS
1000.0000 mL | Freq: Once | INTRAVENOUS | Status: AC
  Administered 2023-09-18: 1000 mL via INTRAVENOUS

## 2023-09-17 MED ORDER — INSULIN ASPART 100 UNIT/ML IV SOLN
10.0000 [IU] | Freq: Once | INTRAVENOUS | Status: AC
  Administered 2023-09-18: 10 [IU] via INTRAVENOUS

## 2023-09-17 NOTE — ED Provider Notes (Signed)
[place] [facility] DEPARTMENT AT [facility] Provider Note   CSN: [id number] Arrival date & time: 09/17/23  2222     History  Chief Complaint  Patient presents with   Numbness    Juan Friedman is a 32 y.o. male.  The history is provided by the patient.  He has history of diabetes, hyperlipidemia and comes in because of numbness in his right arm and right leg which has been present all day.  Symptoms and waxed and waned but not gone away completely.  He feels like his leg was a little weak and he has been worried about lifting things with his arm, but he has not actually dropped anything and not actually had any difficulty walking.  Denies any headache.  He denies any facial numbness.  He does endorse polyuria and polydipsia today.  He also describes a feeling like there is a golf ball in his throat which started yesterday and is still present.  He states that he has been off of all medications for diabetes for several years and he was told that he was no longer diabetic.   Home Medications Prior to Admission medications   Medication Sig Start Date End Date Taking? Authorizing Provider  atorvastatin (LIPITOR) 20 MG tablet Take 1 tablet (20 mg total) by mouth daily. 01/25/21   Hoy Register, MD  blood glucose meter kit and supplies KIT Dispense based on patient and insurance preference. Use up to four times daily as directed. (FOR ICD-9 250.00, 250.01). 05/01/20   McDonald, Mia A, PA-C  insulin glargine (LANTUS) 100 UNIT/ML injection Inject 0.12 mLs (12 Units total) into the skin daily. 08/03/21 11/24/21  Hoy Register, MD  Needles & Syringes MISC For use with Lantus 07/20/20   Hoy Register, MD  oxyCODONE-acetaminophen (PERCOCET/ROXICET) 5-325 MG tablet Take 1 tablet by mouth every 6 (six) hours as needed for severe pain. 01/24/23   Benjiman Core, MD      Allergies    Sudafed [pseudoephedrine]    Review of Systems   Review of Systems  All other systems reviewed and  are negative.   Physical Exam Updated Vital Signs BP (!) 131/92   Pulse 85   Temp 98.5 F (36.9 C)   Resp (!) 22   Ht 5\' 9"  (1.753 m)   Wt 81.6 kg   SpO2 95%   BMI 26.58 kg/m  Physical Exam Vitals and nursing note reviewed.   32 year old male, resting comfortably and in no acute distress. Vital signs are significant for borderline elevated diastolic blood pressure and mildly elevated respiratory rate. Oxygen saturation is 95%, which is normal. Head is normocephalic and atraumatic. PERRLA, EOMI. Oropharynx is clear. Neck is nontender and supple without adenopathy or JVD.  There are no carotid bruits. Back is nontender and there is no CVA tenderness. Lungs are clear without rales, wheezes, or rhonchi. Chest is nontender. Heart has regular rate and rhythm without murmur. Abdomen is soft, flat, nontender. Extremities have no cyanosis or edema, full range of motion is present. Skin is warm and dry without rash. Neurologic: Awake and alert and oriented, speech is normal.  There is no facial asymmetry.  Tongue protrudes in the midline.  Normal strength of gritting teeth and shoulder shrug.  Normal sensation and all 3 branches of trigeminal nerve.  Strength is 5/5 in all 4 extremities.  There is no pronator drift.  Sensation is symmetric.  There is no extinction on double simultaneous stimulation..  ED Results / Procedures /  Treatments   Labs (all labs ordered are listed, but only abnormal results are displayed) Labs Reviewed  COMPREHENSIVE METABOLIC PANEL - Abnormal; Notable for the following components:      Result Value   Sodium 126 (*)    Chloride 92 (*)    Glucose, Bld 688 (*)    Calcium 8.8 (*)    AST 12 (*)    Total Bilirubin 1.2 (*)    All other components within normal limits  CBC WITH DIFFERENTIAL/PLATELET - Abnormal; Notable for the following components:   MCHC 36.2 (*)    All other components within normal limits  URINALYSIS, ROUTINE W REFLEX MICROSCOPIC - Abnormal;  Notable for the following components:   Color, Urine COLORLESS (*)    Specific Gravity, Urine 1.032 (*)    Glucose, UA >1,000 (*)    All other components within normal limits  I-STAT VENOUS BLOOD GAS, ED - Abnormal; Notable for the following components:   pO2, Ven 22 (*)    Bicarbonate 32.7 (*)    TCO2 35 (*)    Acid-Base Excess 5.0 (*)    Sodium 129 (*)    Potassium 5.9 (*)    All other components within normal limits  CBG MONITORING, ED - Abnormal; Notable for the following components:   Glucose-Capillary 450 (*)    All other components within normal limits  CBG MONITORING, ED - Abnormal; Notable for the following components:   Glucose-Capillary 305 (*)    All other components within normal limits  CBG MONITORING, ED - Abnormal; Notable for the following components:   Glucose-Capillary 157 (*)    All other components within normal limits  BETA-HYDROXYBUTYRIC ACID   Radiology CT Head Wo Contrast  Result Date: 09/18/2023 CLINICAL DATA:  Acute stroke suspected.  Right-sided numbness. EXAM: CT HEAD WITHOUT CONTRAST TECHNIQUE: Contiguous axial images were obtained from the base of the skull through the vertex without intravenous contrast. RADIATION DOSE REDUCTION: This exam was performed according to the departmental dose-optimization program which includes automated exposure control, adjustment of the mA and/or kV according to patient size and/or use of iterative reconstruction technique. COMPARISON:  None Available. FINDINGS: Brain: No evidence of acute infarction, hemorrhage, hydrocephalus, extra-axial collection or mass lesion/mass effect. Vascular: No hyperdense vessel or unexpected calcification. Skull: Normal. Negative for fracture or focal lesion. Sinuses/Orbits: There is mucosal thickening of the left sphenoid sinus. The orbits are within normal limits. Other: None. IMPRESSION: 1. No acute intracranial process. 2. Left sphenoid sinus disease. Electronically Signed   By: Darliss Cheney M.D.   On: 09/18/2023 00:40    Procedures Procedures    Medications Ordered in ED Medications  metFORMIN (GLUCOPHAGE) tablet 500 mg (500 mg Oral Patient Refused/Not Given 09/18/23 0603)  sodium chloride 0.9 % bolus 1,000 mL (0 mLs Intravenous Stopped 09/18/23 0435)  insulin aspart (novoLOG) injection 10 Units (10 Units Intravenous Given 09/18/23 0009)  insulin aspart (novoLOG) injection 10 Units (10 Units Intravenous Given 09/18/23 0253)  insulin aspart (novoLOG) injection 10 Units (10 Units Intravenous Given 09/18/23 0438)    ED Course/ Medical Decision Making/ A&P                                 Medical Decision Making Amount and/or Complexity of Data Reviewed Labs: ordered. Radiology: ordered.  Risk OTC drugs. Prescription drug management.   Right-sided numbness without any objective findings on exam.  I have low index of suspicion  for stroke.  I have reviewed his laboratory test, and my interpretation is marked hyperglycemia, hyponatremia commensurate with degree of hyperglycemia, normal anion gap mild elevation of total bilirubin which has been present previously, normal CBC.  I have low index of suspicion for ketoacidosis, but I have ordered venous blood gas and beta hydroxybutyrate level.  I have ordered IV fluids and IV insulin.  Planed that he no longer has diabetes, I can only find 1 normal blood glucose which was on 08/03/2020, and his A1c levels have all been elevated.  Most recent A1c was on 01/20/2021 and was 7.2.  At this point, I think his symptoms are likely related to hyperglycemia and I will need to reevaluate him once glucose has normalized.  I have ordered CT of head to rule out stroke and intracranial lesion such as tumor.  Venous blood gas shows normal pH.  Beta hydroxybutyrate level is normal.  No evidence of ketoacidosis.  CT scan shows no acute intracranial process.  Have independently viewed the images, and agree with radiologist's interpretation.   Glucose did respond to IV hydration and multiple doses of insulin.  Once glucose did come down below 200, I went to reevaluate the patient and the numbness had resolved.  Apparently, it was secondary to hyperglycemia.  No evidence of stroke.  I have ordered a dose of metformin and I am discharging the patient with a prescription for metformin.  I have advised him that he is likely to need more medication than just metformin and may need to go back on insulin.  Patient is very resistant to this.  I have advised him that if he fails to control his blood sugar adequately, it can lead to serious complications such as blindness, stroke, amputations.  I have advised him to return if his symptoms are coming back.  He will need to start monitoring his blood sugars at home.  CRITICAL CARE Performed by: Dione Booze Total critical care time: 190 minutes Critical care time was exclusive of separately billable procedures and treating other patients. Critical care was necessary to treat or prevent imminent or life-threatening deterioration. Critical care was time spent personally by me on the following activities: development of treatment plan with patient and/or surrogate as well as nursing, discussions with consultants, evaluation of patient's response to treatment, examination of patient, obtaining history from patient or surrogate, ordering and performing treatments and interventions, ordering and review of laboratory studies, ordering and review of radiographic studies, pulse oximetry and re-evaluation of patient's condition.  Final Clinical Impression(s) / ED Diagnoses Final diagnoses:  Hyperglycemia  Paresthesia    Rx / DC Orders ED Discharge Orders          Ordered    metFORMIN (GLUCOPHAGE) 500 MG tablet  2 times daily with meals        09/18/23 0553              Dione Booze, MD 09/18/23 [phone]

## 2023-09-17 NOTE — ED Triage Notes (Addendum)
Pt reports intermittent numbness "all on the right side of my body" since this morning. Numbness worsens when pt is sitting or walking. Pt also reports globus sensation when he swallows.

## 2023-09-18 ENCOUNTER — Emergency Department (HOSPITAL_BASED_OUTPATIENT_CLINIC_OR_DEPARTMENT_OTHER): Payer: Medicaid Other

## 2023-09-18 LAB — URINALYSIS, ROUTINE W REFLEX MICROSCOPIC
Bacteria, UA: NONE SEEN
Bilirubin Urine: NEGATIVE
Glucose, UA: >1000 mg/dL — AB
Hgb urine dipstick: NEGATIVE
Ketones, ur: NEGATIVE mg/dL
Leukocytes,Ua: NEGATIVE
Nitrite: NEGATIVE
Protein, ur: NEGATIVE mg/dL
Specific Gravity, Urine: 1.032 — ABNORMAL HIGH (ref 1.005–1.030)
pH: 6 (ref 5.0–8.0)

## 2023-09-18 LAB — CBG MONITORING, ED
Glucose-Capillary: 450 mg/dL — ABNORMAL HIGH (ref 70–99)
Glucose-Capillary: 305 mg/dL — ABNORMAL HIGH (ref 70–99)
Glucose-Capillary: 157 mg/dL — ABNORMAL HIGH (ref 70–99)

## 2023-09-18 LAB — I-STAT VENOUS BLOOD GAS, ED
Acid-Base Excess: 5 mmol/L — ABNORMAL HIGH (ref 0.0–2.0)
Bicarbonate: 32.7 mmol/L — ABNORMAL HIGH (ref 20.0–28.0)
Calcium, Ion: 1.19 mmol/L (ref 1.15–1.40)
HCT: 45 % (ref 39.0–52.0)
Hemoglobin: 15.3 g/dL (ref 13.0–17.0)
O2 Saturation: 33 %
Patient temperature: 98.4
Potassium: 5.9 mmol/L — ABNORMAL HIGH (ref 3.5–5.1)
Sodium: 129 mmol/L — ABNORMAL LOW (ref 135–145)
TCO2: 35 mmol/L — ABNORMAL HIGH (ref 22–32)
pCO2, Ven: 58.4 mm[Hg] (ref 44–60)
pH, Ven: 7.356 (ref 7.25–7.43)
pO2, Ven: 22 mm[Hg] — CL (ref 32–45)

## 2023-09-18 LAB — BETA-HYDROXYBUTYRIC ACID: Beta-Hydroxybutyric Acid: 0.2 mmol/L (ref 0.05–0.27)

## 2023-09-18 MED ORDER — METFORMIN HCL 500 MG PO TABS
500.0000 mg | ORAL_TABLET | Freq: Once | ORAL | Status: DC
  Filled 2023-09-18: qty 1

## 2023-09-18 MED ORDER — INSULIN ASPART 100 UNIT/ML IV SOLN
10.0000 [IU] | Freq: Once | INTRAVENOUS | Status: AC
  Administered 2023-09-18: 10 [IU] via INTRAVENOUS

## 2023-09-18 MED ORDER — METFORMIN HCL 500 MG PO TABS: 500.0000 mg | ORAL_TABLET | Freq: Two times a day (BID) | ORAL | 3 refills | Status: AC

## 2023-09-18 NOTE — Discharge Instructions (Addendum)
Your blood sugar was very high today.  You need to be back on medication to control it.  Your numbness appears to have been caused by your high blood sugar.  There is no sign that you had a stroke.  You will need to monitor your blood sugar at home.  I strongly suspect that she will need to go back on insulin.  You will need to work with your primary care provider to adjust your medication to keep your blood sugar under control.  Poorly controlled blood sugar can lead to heart attacks, strokes, kidney failure, amputations.
